# Patient Record
Sex: Female | Born: 1960 | Smoking: Current some day smoker
Health system: Southern US, Community
[De-identification: ages and names within clinical notes are randomized; demographics above are authoritative.]

## PROBLEM LIST (undated history)

## (undated) DIAGNOSIS — J45909 Unspecified asthma, uncomplicated: Secondary | ICD-10-CM

## (undated) DIAGNOSIS — E669 Obesity, unspecified: Secondary | ICD-10-CM

## (undated) DIAGNOSIS — J439 Emphysema, unspecified: Secondary | ICD-10-CM

## (undated) DIAGNOSIS — J449 Chronic obstructive pulmonary disease, unspecified: Secondary | ICD-10-CM

---

## 2016-11-25 DIAGNOSIS — J449 Chronic obstructive pulmonary disease, unspecified: Secondary | ICD-10-CM | POA: Diagnosis present

## 2018-05-23 ENCOUNTER — Other Ambulatory Visit: Payer: Self-pay | Admitting: Preventative Medicine

## 2018-05-23 DIAGNOSIS — J449 Chronic obstructive pulmonary disease, unspecified: Secondary | ICD-10-CM

## 2018-05-23 DIAGNOSIS — J45909 Unspecified asthma, uncomplicated: Secondary | ICD-10-CM

## 2018-05-23 DIAGNOSIS — J439 Emphysema, unspecified: Secondary | ICD-10-CM

## 2018-06-21 ENCOUNTER — Ambulatory Visit: Payer: Disability Insurance | Attending: Preventative Medicine

## 2018-06-21 DIAGNOSIS — J449 Chronic obstructive pulmonary disease, unspecified: Secondary | ICD-10-CM | POA: Diagnosis not present

## 2018-06-21 DIAGNOSIS — J439 Emphysema, unspecified: Secondary | ICD-10-CM

## 2018-06-21 DIAGNOSIS — J45909 Unspecified asthma, uncomplicated: Secondary | ICD-10-CM

## 2018-06-21 NOTE — Progress Notes (Unsigned)
Attempted to do Spirometry but patient stated she was not feeling well and felt that she was coming down with something. She stated she did feel as if she was able to give her best at testing. She also stated she had been having chest pains off and on all morning. She was asked if she would like to be taken to the ER but she stated No. [place] [employer] services was contacted and Arlan Organ was spoken to about patients ability to do testing. It was decided for patient to reschedule test.

## 2020-08-31 ENCOUNTER — Observation Stay
Admission: EM | Admit: 2020-08-31 | Discharge: 2020-09-01 | DRG: 177 | Discharge status: Against Medical Advice | Payer: Medicaid Other | Attending: Internal Medicine | Admitting: Internal Medicine

## 2020-08-31 DIAGNOSIS — U071 COVID-19: Secondary | ICD-10-CM | POA: Insufficient documentation

## 2020-08-31 DIAGNOSIS — J439 Emphysema, unspecified: Secondary | ICD-10-CM | POA: Diagnosis not present

## 2020-08-31 DIAGNOSIS — J9601 Acute respiratory failure with hypoxia: Secondary | ICD-10-CM | POA: Insufficient documentation

## 2020-08-31 DIAGNOSIS — E871 Hypo-osmolality and hyponatremia: Secondary | ICD-10-CM | POA: Diagnosis present

## 2020-08-31 DIAGNOSIS — R0789 Other chest pain: Secondary | ICD-10-CM | POA: Insufficient documentation

## 2020-08-31 DIAGNOSIS — F17210 Nicotine dependence, cigarettes, uncomplicated: Secondary | ICD-10-CM | POA: Insufficient documentation

## 2020-08-31 DIAGNOSIS — Z5329 Procedure and treatment not carried out because of patient's decision for other reasons: Secondary | ICD-10-CM | POA: Insufficient documentation

## 2020-08-31 DIAGNOSIS — J1282 Pneumonia due to coronavirus disease 2019: Secondary | ICD-10-CM

## 2020-08-31 DIAGNOSIS — J449 Chronic obstructive pulmonary disease, unspecified: Secondary | ICD-10-CM | POA: Diagnosis present

## 2020-08-31 HISTORY — DX: Obesity, unspecified: E66.9

## 2020-08-31 HISTORY — DX: Emphysema, unspecified: J43.9

## 2020-08-31 HISTORY — DX: Unspecified asthma, uncomplicated: J45.909

## 2020-08-31 HISTORY — DX: Chronic obstructive pulmonary disease, unspecified: J44.9

## 2020-08-31 NOTE — ED Triage Notes (Signed)
Pt called ems this evening for SHOB starting at 4pm. Pt was low 90s on RA. Pt placed onNC, 20g placed in RAC. Pt palced on stretcher on arrival. Sating 92 RA.

## 2020-09-01 ENCOUNTER — Other Ambulatory Visit: Payer: Self-pay

## 2020-09-01 ENCOUNTER — Emergency Department: Payer: Medicaid Other

## 2020-09-01 ENCOUNTER — Encounter: Payer: Self-pay | Admitting: Emergency Medicine

## 2020-09-01 DIAGNOSIS — J1282 Pneumonia due to coronavirus disease 2019: Secondary | ICD-10-CM

## 2020-09-01 DIAGNOSIS — E871 Hypo-osmolality and hyponatremia: Secondary | ICD-10-CM | POA: Diagnosis present

## 2020-09-01 DIAGNOSIS — J9601 Acute respiratory failure with hypoxia: Secondary | ICD-10-CM | POA: Diagnosis present

## 2020-09-01 DIAGNOSIS — J9600 Acute respiratory failure, unspecified whether with hypoxia or hypercapnia: Secondary | ICD-10-CM

## 2020-09-01 DIAGNOSIS — U071 COVID-19: Principal | ICD-10-CM

## 2020-09-01 DIAGNOSIS — F17210 Nicotine dependence, cigarettes, uncomplicated: Secondary | ICD-10-CM | POA: Diagnosis present

## 2020-09-01 DIAGNOSIS — J439 Emphysema, unspecified: Secondary | ICD-10-CM | POA: Diagnosis present

## 2020-09-01 LAB — CBC WITH DIFFERENTIAL/PLATELET
Abs Immature Granulocytes: 0.06 10*3/uL (ref 0.00–0.07)
Basophils Absolute: 0.1 10*3/uL (ref 0.0–0.1)
Basophils Relative: 1 %
Eosinophils Absolute: 0.3 10*3/uL (ref 0.0–0.5)
Eosinophils Relative: 3 %
HCT: 43 % (ref 36.0–46.0)
Hemoglobin: 13.1 g/dL (ref 12.0–15.0)
Immature Granulocytes: 1 %
Lymphocytes Relative: 13 %
Lymphs Abs: 1.5 10*3/uL (ref 0.7–4.0)
MCH: 21 pg — ABNORMAL LOW (ref 26.0–34.0)
MCHC: 30.5 g/dL (ref 30.0–36.0)
MCV: 68.9 fL — ABNORMAL LOW (ref 80.0–100.0)
Monocytes Absolute: 0.8 10*3/uL (ref 0.1–1.0)
Monocytes Relative: 8 %
Neutro Abs: 8.3 10*3/uL — ABNORMAL HIGH (ref 1.7–7.7)
Neutrophils Relative %: 74 %
Platelets: 283 10*3/uL (ref 150–400)
RBC: 6.24 MIL/uL — ABNORMAL HIGH (ref 3.87–5.11)
RDW: 16.7 % — ABNORMAL HIGH (ref 11.5–15.5)
WBC: 11 10*3/uL — ABNORMAL HIGH (ref 4.0–10.5)
nRBC: 0 % (ref 0.0–0.2)

## 2020-09-01 LAB — COMPREHENSIVE METABOLIC PANEL
ALT: 22 U/L (ref 0–44)
AST: 22 U/L (ref 15–41)
Albumin: 3.9 g/dL (ref 3.5–5.0)
Alkaline Phosphatase: 91 U/L (ref 38–126)
Anion gap: 7 (ref 5–15)
BUN: 8 mg/dL (ref 6–20)
CO2: 24 mmol/L (ref 22–32)
Calcium: 8.8 mg/dL — ABNORMAL LOW (ref 8.9–10.3)
Chloride: 103 mmol/L (ref 98–111)
Creatinine, Ser: 0.65 mg/dL (ref 0.44–1.00)
GFR, Estimated: >60 mL/min (ref >60)
Glucose, Bld: 101 mg/dL — ABNORMAL HIGH (ref 70–99)
Potassium: 3.9 mmol/L (ref 3.5–5.1)
Sodium: 134 mmol/L — ABNORMAL LOW (ref 135–145)
Total Bilirubin: 0.6 mg/dL (ref 0.3–1.2)
Total Protein: 7.3 g/dL (ref 6.5–8.1)

## 2020-09-01 LAB — CBC
HCT: 43.5 % (ref 36.0–46.0)
Hemoglobin: 13.6 g/dL (ref 12.0–15.0)
MCH: 21.4 pg — ABNORMAL LOW (ref 26.0–34.0)
MCHC: 31.3 g/dL (ref 30.0–36.0)
MCV: 68.4 fL — ABNORMAL LOW (ref 80.0–100.0)
Platelets: 269 10*3/uL (ref 150–400)
RBC: 6.36 MIL/uL — ABNORMAL HIGH (ref 3.87–5.11)
RDW: 17.3 % — ABNORMAL HIGH (ref 11.5–15.5)
WBC: 8.2 10*3/uL (ref 4.0–10.5)
nRBC: 0 % (ref 0.0–0.2)

## 2020-09-01 LAB — HIV ANTIBODY (ROUTINE TESTING W REFLEX): HIV Screen 4th Generation wRfx: NONREACTIVE

## 2020-09-01 LAB — RESP PANEL BY RT-PCR (FLU A&B, COVID) ARPGX2
Influenza A by PCR: NEGATIVE
Influenza B by PCR: NEGATIVE
SARS Coronavirus 2 by RT PCR: POSITIVE — AB

## 2020-09-01 LAB — CREATININE, SERUM
Creatinine, Ser: 0.56 mg/dL (ref 0.44–1.00)
GFR, Estimated: >60 mL/min (ref >60)

## 2020-09-01 LAB — BRAIN NATRIURETIC PEPTIDE: B Natriuretic Peptide: 25.8 pg/mL (ref 0.0–100.0)

## 2020-09-01 LAB — TROPONIN I (HIGH SENSITIVITY)
Troponin I (High Sensitivity): 3 ng/L (ref <18)
Troponin I (High Sensitivity): 3 ng/L (ref <18)

## 2020-09-01 MED ORDER — SODIUM CHLORIDE 0.9 % IV SOLN: 100.0000 mg | Freq: Every day | INTRAVENOUS | Status: DC

## 2020-09-01 MED ORDER — ENOXAPARIN SODIUM 40 MG/0.4ML ~~LOC~~ SOLN
40.0000 mg | SUBCUTANEOUS | Status: DC
  Administered 2020-09-01: 10:00:00 40 mg via SUBCUTANEOUS
  Filled 2020-09-01: qty 0.4

## 2020-09-01 MED ORDER — ASCORBIC ACID 500 MG PO TABS
500.0000 mg | ORAL_TABLET | Freq: Every day | ORAL | Status: DC
  Administered 2020-09-01: 10:00:00 500 mg via ORAL
  Filled 2020-09-01: qty 1

## 2020-09-01 MED ORDER — UMECLIDINIUM BROMIDE 62.5 MCG/INH IN AEPB
1.0000 | INHALATION_SPRAY | Freq: Every day | RESPIRATORY_TRACT | Status: DC
  Filled 2020-09-01: qty 7

## 2020-09-01 MED ORDER — DIPHENHYDRAMINE HCL 50 MG/ML IJ SOLN: 50.0000 mg | Freq: Once | INTRAMUSCULAR | Status: DC | PRN

## 2020-09-01 MED ORDER — ZINC SULFATE 220 (50 ZN) MG PO CAPS
220.0000 mg | ORAL_CAPSULE | Freq: Every day | ORAL | Status: DC
  Administered 2020-09-01: 10:00:00 220 mg via ORAL
  Filled 2020-09-01: qty 1

## 2020-09-01 MED ORDER — ONDANSETRON HCL 4 MG PO TABS: 4.0000 mg | ORAL_TABLET | Freq: Four times a day (QID) | ORAL | Status: DC | PRN

## 2020-09-01 MED ORDER — FLUTICASONE FUROATE-VILANTEROL 100-25 MCG/INH IN AEPB
1.0000 | INHALATION_SPRAY | Freq: Every day | RESPIRATORY_TRACT | Status: DC
  Filled 2020-09-01: qty 28

## 2020-09-01 MED ORDER — ALBUTEROL SULFATE HFA 108 (90 BASE) MCG/ACT IN AERS
2.0000 | INHALATION_SPRAY | Freq: Four times a day (QID) | RESPIRATORY_TRACT | Status: DC
  Administered 2020-09-01: 12:00:00 2 via RESPIRATORY_TRACT
  Filled 2020-09-01 (×2): qty 6.7

## 2020-09-01 MED ORDER — ACETAMINOPHEN 325 MG PO TABS: 650.0000 mg | ORAL_TABLET | Freq: Four times a day (QID) | ORAL | Status: DC | PRN

## 2020-09-01 MED ORDER — LORATADINE 10 MG PO TABS: 10.0000 mg | ORAL_TABLET | Freq: Every day | ORAL | Status: DC

## 2020-09-01 MED ORDER — ONDANSETRON HCL 4 MG/2ML IJ SOLN: 4.0000 mg | Freq: Four times a day (QID) | INTRAMUSCULAR | Status: DC | PRN

## 2020-09-01 MED ORDER — AZITHROMYCIN 500 MG PO TABS: 250.0000 mg | ORAL_TABLET | Freq: Every day | ORAL | Status: DC

## 2020-09-01 MED ORDER — SODIUM CHLORIDE 0.9 % IV SOLN: INTRAVENOUS | Status: DC | PRN

## 2020-09-01 MED ORDER — SODIUM CHLORIDE 0.9 % IV BOLUS
500.0000 mL | Freq: Once | INTRAVENOUS | Status: AC
  Administered 2020-09-01: 02:00:00 500 mL via INTRAVENOUS

## 2020-09-01 MED ORDER — DEXAMETHASONE SODIUM PHOSPHATE 10 MG/ML IJ SOLN: 6.0000 mg | INTRAMUSCULAR | Status: DC

## 2020-09-01 MED ORDER — ALBUTEROL SULFATE HFA 108 (90 BASE) MCG/ACT IN AERS
2.0000 | INHALATION_SPRAY | Freq: Once | RESPIRATORY_TRACT | Status: DC | PRN
  Filled 2020-09-01: qty 6.7

## 2020-09-01 MED ORDER — FAMOTIDINE IN NACL 20-0.9 MG/50ML-% IV SOLN
20.0000 mg | Freq: Once | INTRAVENOUS | Status: DC | PRN
  Filled 2020-09-01: qty 50

## 2020-09-01 MED ORDER — ETESEVIMAB 700MG/ 20ML INJECTION: Freq: Once | INTRAVENOUS | Status: DC

## 2020-09-01 MED ORDER — FLUTICASONE-UMECLIDIN-VILANT 100-62.5-25 MCG/INH IN AEPB: 1.0000 | INHALATION_SPRAY | Freq: Every day | RESPIRATORY_TRACT | Status: DC

## 2020-09-01 MED ORDER — ACETAMINOPHEN 500 MG PO TABS
1000.0000 mg | ORAL_TABLET | Freq: Once | ORAL | Status: AC
  Administered 2020-09-01: 02:00:00 1000 mg via ORAL
  Filled 2020-09-01: qty 2

## 2020-09-01 MED ORDER — IOHEXOL 350 MG/ML SOLN
100.0000 mL | Freq: Once | INTRAVENOUS | Status: AC | PRN
  Administered 2020-09-01: 100 mL via INTRAVENOUS

## 2020-09-01 MED ORDER — METHYLPREDNISOLONE SODIUM SUCC 125 MG IJ SOLR: 125.0000 mg | Freq: Once | INTRAMUSCULAR | Status: DC | PRN

## 2020-09-01 MED ORDER — GUAIFENESIN-DM 100-10 MG/5ML PO SYRP: 10.0000 mL | ORAL_SOLUTION | ORAL | Status: DC | PRN

## 2020-09-01 MED ORDER — HYDROCOD POLST-CPM POLST ER 10-8 MG/5ML PO SUER: 5.0000 mL | Freq: Two times a day (BID) | ORAL | Status: DC | PRN

## 2020-09-01 MED ORDER — EPINEPHRINE 0.3 MG/0.3ML IJ SOAJ
0.3000 mg | Freq: Once | INTRAMUSCULAR | Status: DC | PRN
  Filled 2020-09-01: qty 0.3

## 2020-09-01 MED ORDER — SODIUM CHLORIDE 0.9 % IV SOLN
200.0000 mg | Freq: Once | INTRAVENOUS | Status: AC
  Administered 2020-09-01: 04:00:00 200 mg via INTRAVENOUS
  Filled 2020-09-01: qty 200

## 2020-09-01 MED ORDER — AZITHROMYCIN 500 MG PO TABS: 500.0000 mg | ORAL_TABLET | Freq: Every day | ORAL | Status: DC

## 2020-09-01 MED ORDER — DEXAMETHASONE SODIUM PHOSPHATE 10 MG/ML IJ SOLN
10.0000 mg | Freq: Once | INTRAMUSCULAR | Status: AC
  Administered 2020-09-01: 02:00:00 10 mg via INTRAVENOUS
  Filled 2020-09-01: qty 1

## 2020-09-01 MED ORDER — SODIUM CHLORIDE 0.9 % IV SOLN: 200.0000 mg | Freq: Once | INTRAVENOUS | Status: DC

## 2020-09-01 MED ORDER — KETOROLAC TROMETHAMINE 30 MG/ML IJ SOLN
30.0000 mg | Freq: Once | INTRAMUSCULAR | Status: AC
  Administered 2020-09-01: 09:00:00 30 mg via INTRAVENOUS
  Filled 2020-09-01: qty 1

## 2020-09-01 NOTE — Progress Notes (Signed)
Pharmacy COVID-19 Monoclonal Antibody Screening  Aimee Hooper was identified as being not hospitalized with symptoms from Covid-19 on admission but an incidental positive PCR has been documented.  The patient may qualify for the use of monoclonal antibodies (mAB) for COVID-19 viral infection to prevent worsening symptoms stemming from Covid-19 infection.  The patient was identified based on a positive COVID-19 PCR and not requiring the use of supplemental oxygen at this time.  This patient meets the FDA criteria for Emergency Use Authorization of casirivimab/imdevimab or bamlanivimab/etesevimab.  Has a (+) direct SARS-CoV-2 viral test result  Is NOT hospitalized due to COVID-19  Is within 10 days of symptom onset  Has at least one of the high risk factor(s) for progression to severe COVID-19 and/or hospitalization as defined in EUA.  Specific high risk criteria : Chronic Lung Disease  Additionally: The patient  HAS NOT had a positive COVID-19 PCR in the last 90 days.  The patient is partially vaccinated against COVID-19.  The patient does not meet criteria for mAB administration due to being partially or fully vaccinated for COVID-19, asymptomatic, with a cycle time > 32. Patient is being hospitalized for respiratory failure due to COVID-19 (per notes). D/w Dr. Nelson Chimes that does not meet in-patient criteria (on oxygen, but near baseline) and primary reason for hospitalization is for COVID.   This eligibility and indication for treatment was discussed with the patient's physician: Dr. Nelson Chimes  Plan: Based on the above discussion, it was decided that the patient will NOT receive a dose of mAB combination.   Katha Cabal 09/01/2020  1:27 PM

## 2020-09-01 NOTE — Progress Notes (Signed)
PROGRESS NOTE    Aimee Hooper  GYB:[id number] DOB: 03-18-1961 DOA: 08/31/2020 PCP: Patient, No Pcp Per   Brief Narrative: Taken from H&P. Aimee Hooper is a 59 y.o. female with medical history significant for stage III COPD not on home oxygen who presents to the emergency room with sudden onset shortness of breath associated with retrosternal chest pain.  Pain described as sharp, nonradiating, of moderate intensity.  She was previously in her usual state of health and denies recent fever or chills.  Denies Covid exposure.  Had only one Covid vaccine a month prior.   She was febrile at 100.2, COVID-19 positive, mildly hypoxic requiring 2 L of oxygen.  She was started on remdesivir and steroid.  Subjective: Patient is feeling little better when seen this morning, continue to have some shortness of breath.  No nausea or vomiting.  No chest pain.  Assessment & Plan:   Principal Problem:   Pneumonia due to COVID-19 virus Active Problems:   Stage 3 severe COPD by GOLD classification (HCC)   Acute respiratory failure due to COVID-19 [facility])  Acute hypoxic respiratory failure due to COVID-19 infection/COPD exacerbation.  Patient did had some tight chest.  CTA negative for PE or any other acute pulmonary pathology.  Signs of chronic emphysema. Requiring 2 L of oxygen to maintain saturation above 90%, patient has advanced COPD at baseline and might be becoming oxygen dependent. Got 1 dose of remdesivir in ED. Good candidate for monoclonal antibody which was ordered. -Continue with steroid. -Continue with bronchodilators -Z-Pak for COPD exacerbation. -Continue with supportive care. -Supplemental oxygen to keep saturation above 90%-wean as tolerated. -Monitor inflammatory markers  Chest pain.  Patient has atypical chest pain which has been resolved now.  EKG without any acute changes and troponin remain negative.  CTA was negative for PE. -Monitor.  Objective: Vitals:   09/01/20  0927 09/01/20 0930 09/01/20 1100 09/01/20 1200  BP:  (!) 84/64 96/66 (!) 110/58  Pulse: 83 80 79 84  Resp: 20 20 20  (!) 24  Temp:    97.8 F (36.6 C)  TempSrc:    Oral  SpO2: 94% 94% 93% 92%  Weight:      Height:       No intake or output data in the 24 hours ending 09/01/20 1247 Filed Weights   08/31/20 2344  Weight: 77.1 kg    Examination:  General exam: Appears calm and comfortable  Respiratory system: Clear to auscultation, mildly decreased air entry, no wheezing, respiratory effort normal. Cardiovascular system: S1 & S2 heard, RRR.  Gastrointestinal system: Soft, nontender, nondistended, bowel sounds positive. Central nervous system: Alert and oriented. No focal neurological deficits. Extremities: No edema, no cyanosis, pulses intact and symmetrical. Psychiatry: Judgement and insight appear normal. Mood & affect appropriate.    DVT prophylaxis: Lovenox Code Status: Full Family Communication: Discussed with patient, no one listed on her chart. Disposition Plan:  Status is: Inpatient  Remains inpatient appropriate because:Inpatient level of care appropriate due to severity of illness   Dispo: The patient is from: Home              Anticipated d/c is to: Home              Anticipated d/c date is: 2 days              Patient currently is not medically stable to d/c.   Consultants:   None  Procedures:  Antimicrobials:   Data Reviewed: I have  personally reviewed following labs and imaging studies  CBC: Recent Labs  Lab 08/31/20 2354 09/01/20 0655  WBC 11.0* 8.2  NEUTROABS 8.3*  --   HGB 13.1 13.6  HCT 43.0 43.5  MCV 68.9* 68.4*  PLT 283 269   Basic Metabolic Panel: Recent Labs  Lab 08/31/20 2354 09/01/20 0655  NA 134*  --   K 3.9  --   CL 103  --   CO2 24  --   GLUCOSE 101*  --   BUN 8  --   CREATININE 0.65 0.56  CALCIUM 8.8*  --    GFR: Estimated Creatinine Clearance: 68.6 mL/min (by C-G formula based on SCr of 0.56 mg/dL). Liver Function  Tests: Recent Labs  Lab 08/31/20 2354  AST 22  ALT 22  ALKPHOS 91  BILITOT 0.6  PROT 7.3  ALBUMIN 3.9   No results for input(s): LIPASE, AMYLASE in the last 168 hours. No results for input(s): AMMONIA in the last 168 hours. Coagulation Profile: No results for input(s): INR, PROTIME in the last 168 hours. Cardiac Enzymes: No results for input(s): CKTOTAL, CKMB, CKMBINDEX, TROPONINI in the last 168 hours. BNP (last 3 results) No results for input(s): PROBNP in the last 8760 hours. HbA1C: No results for input(s): HGBA1C in the last 72 hours. CBG: No results for input(s): GLUCAP in the last 168 hours. Lipid Profile: No results for input(s): CHOL, HDL, LDLCALC, TRIG, CHOLHDL, LDLDIRECT in the last 72 hours. Thyroid Function Tests: No results for input(s): TSH, T4TOTAL, FREET4, T3FREE, THYROIDAB in the last 72 hours. Anemia Panel: No results for input(s): VITAMINB12, FOLATE, FERRITIN, TIBC, IRON, RETICCTPCT in the last 72 hours. Sepsis Labs: No results for input(s): PROCALCITON, LATICACIDVEN in the last 168 hours.  Recent Results (from the past 240 hour(s))  Resp Panel by RT-PCR (Flu A&B, Covid) Nasopharyngeal Swab     Status: Abnormal   Collection Time: 08/31/20 11:56 PM   Specimen: Nasopharyngeal Swab; Nasopharyngeal(NP) swabs in vial transport medium  Result Value Ref Range Status   SARS Coronavirus 2 by RT PCR POSITIVE (A) NEGATIVE Final    Comment: RESULT CALLED TO, READ BACK BY AND VERIFIED WITH: JULIA SHEEHAN 09/01/20 AT 0112 HS    Influenza A by PCR NEGATIVE NEGATIVE Final   Influenza B by PCR NEGATIVE NEGATIVE Final    Comment: (NOTE) The Xpert Xpress SARS-CoV-2/FLU/RSV plus assay is intended as an aid in the diagnosis of influenza from Nasopharyngeal swab specimens and should not be used as a sole basis for treatment. Nasal washings and aspirates are unacceptable for Xpert Xpress SARS-CoV-2/FLU/RSV testing.  Fact Sheet for  Patients: BloggerCourse.com  Fact Sheet for Healthcare Providers: SeriousBroker.it  This test is not yet approved or cleared by the [place] FDA and has been authorized for detection and/or diagnosis of SARS-CoV-2 by FDA under an Emergency Use Authorization (EUA). This EUA will remain in effect (meaning this test can be used) for the duration of the COVID-19 declaration under Section 564(b)(1) of the Act, 21 U.S.C. section 360bbb-3(b)(1), unless the authorization is terminated or revoked.  Performed at [facility], [address], [place], [place] [address]      Radiology Studies: CT Angio Chest PE W/Cm &/Or Wo Cm  Result Date: 09/01/2020 CLINICAL DATA:  COVID positive with shortness of breath. EXAM: CT ANGIOGRAPHY CHEST WITH CONTRAST TECHNIQUE: Multidetector CT imaging of the chest was performed using the standard protocol during bolus administration of intravenous contrast. Multiplanar CT image reconstructions and MIPs were obtained to  evaluate the vascular anatomy. CONTRAST:  OMNIPAQUE IOHEXOL 350 MG/ML SOLN COMPARISON:  None. FINDINGS: Cardiovascular: Satisfactory opacification of the pulmonary arteries to the segmental level. No evidence of pulmonary embolism. Normal heart size. No pericardial effusion. Mediastinum/Nodes: No enlarged mediastinal, hilar, or axillary lymph nodes. Thyroid gland, trachea, and esophagus demonstrate no significant findings. Lungs/Pleura: There is mild emphysematous lung disease. Very mild linear atelectasis is seen along the medial aspects of the right upper lobe and left upper lobe. There is no evidence of acute infiltrate, pleural effusion or pneumothorax. Upper Abdomen: No acute abnormality. Musculoskeletal: No chest wall abnormality. No acute or significant osseous findings. Review of the MIP images confirms the above findings. IMPRESSION: 1. No CT evidence of acute pulmonary embolism.  2. No acute infiltrate, pleural effusion or pneumothorax. 3. Emphysema. Emphysema (ICD10-J43.9). Electronically Signed   By: Aram Candela M.D.   On: 09/01/2020 03:28   DG Chest Port 1 View  Result Date: 09/01/2020 CLINICAL DATA:  Shortness of breath. EXAM: PORTABLE CHEST 1 VIEW COMPARISON:  01/23/2020 FINDINGS: The cardiomediastinal silhouette is unchanged with normal heart size. Bronchitic changes are mildly increased compared to the prior study. No confluent airspace opacity, sizeable pleural effusion, or pneumothorax is identified. No acute osseous abnormality is seen. IMPRESSION: Mildly increased bronchitic changes. Electronically Signed   By: Sebastian Ache M.D.   On: 09/01/2020 00:19    Scheduled Meds: . albuterol  2 puff Inhalation Q6H  . vitamin C  500 mg Oral Daily  . dexamethasone (DECADRON) injection  6 mg Intravenous Q24H  . enoxaparin (LOVENOX) injection  40 mg Subcutaneous Q24H  . fluticasone furoate-vilanterol  1 puff Inhalation Daily   And  . umeclidinium bromide  1 puff Inhalation Daily  . loratadine  10 mg Oral Daily  . zinc sulfate  220 mg Oral Daily   Continuous Infusions: . [START ON 09/02/2020] remdesivir 100 mg in NS 100 mL       LOS: 0 days   Time spent: 35 minutes.  Arnetha Courser, MD [facility]  If 7PM-7AM, please contact night-coverage [email or web address]  09/01/2020, 12:47 PM   This record has been created using Conservation officer, historic buildings. Errors have been sought and corrected,but may not always be located. Such creation errors do not reflect on the standard of care.

## 2020-09-01 NOTE — ED Notes (Signed)
Breakfast tray provided. Pt sitting up in bed.

## 2020-09-01 NOTE — ED Notes (Signed)
Attending provider, Dr. Nelson Chimes, at bedside. She decreased oxygen to 1L per [place].

## 2020-09-01 NOTE — ED Notes (Signed)
Pharmacy messaged re: missing inhaler.

## 2020-09-01 NOTE — Progress Notes (Signed)
Pt called me to room states that she wants to leave AMA because she doesn't like hearing the hepafilter in the room, explained reason why we have to use hepafilter, states that she really doesn't care, she still wants to leave because she didn't want to come to this hospital anyway, she told the EMS drivers to go to [facility] but they brought her here, states that her husband is taking her to [facility], Dr Nelson Chimes made aware that pt signed AMA papers, Dr Nelson Chimes acknowledged this

## 2020-09-01 NOTE — ED Notes (Signed)
Sent msg to receiving nurse on 1C.

## 2020-09-01 NOTE — ED Notes (Signed)
Upon answering call bell, pt had [place] off and spo2 was 88. Pt was tearful asking about Covid swab result. Pt was informed that the results can take a few hours. Pt asking "When can I go home." Pt was informed that with their hr and spo2, they should remain in the ER. Pt agreeable and resting in bed.

## 2020-09-01 NOTE — ED Notes (Signed)
Pt SPO2 was going down to 88-89% on 1L per [place]. Turned oxygen back to 2L. SPO2 now 90-93%.

## 2020-09-01 NOTE — ED Notes (Signed)
Sent msg to pharmacy for Trelegy inhaler to be sent. Pt given [employer] sandwich that was brought to front desk by family. Pt walked to room toilet and urinated. Steady gait. Back in bed.

## 2020-09-01 NOTE — ED Notes (Signed)
Pt requesting for PTA/home med 'Trelegy' to be ordered. Messaged provider about this.

## 2020-09-01 NOTE — ED Notes (Signed)
Albuterol inhaler not available yet. Will administer when available.

## 2020-09-01 NOTE — Progress Notes (Signed)
Pts husband here at this time to push pt out via wheelchair

## 2020-09-01 NOTE — ED Provider Notes (Signed)
[facility] Emergency Department Provider Note  ____________________________________________   Event Date/Time   First MD Initiated Contact with Patient 08/31/20 2359     (approximate)  I have reviewed the triage vital signs and the nursing notes.   HISTORY  Chief Complaint Shortness of Breath    HPI Aimee Hooper is a 59 y.o. female with medical history as listed below which includes stage III COPD but who does not use oxygen at baseline.  She has had 1 COVID-19 vaccination about a month ago.  She presents tonight by EMS for acute onset and severe shortness of breath associated with sharp chest pains in various places in her anterior chest.  She has not had a recent fever.  The symptoms started just a few hours prior to arrival.  Nothing particular makes them better or worse (except for exertion which makes his shortness of breath worse ) and they are severe.   She said she felt perfectly fine yesterday.  She got together with her family for the holidays.  She has not been around anyone known to have a COVID-19.  She denies nasal congestion, sore throat, loss of smell and taste, abdominal pain, nausea, and vomiting.  She has no history of blood clots in the legs of the lungs and she does not take anticoagulation.        Past Medical History:  Diagnosis Date  . Asthma   . Emphysema lung (HCC)   . Obesity   . Stage 3 severe COPD by GOLD classification [facility](HCC)     Patient Active Problem List   Diagnosis Date Noted  . Pneumonia due to COVID-19 virus 09/01/2020  . Acute respiratory failure due to COVID-19 (HCC) 09/01/2020  . Stage 3 severe COPD by GOLD classification (HCC) 11/25/2016    History reviewed. No pertinent surgical history.  Prior to Admission medications   Not on File    Allergies Patient has no allergy information on record.  History reviewed. No pertinent family history.  Social History Social History   Tobacco Use  .  Smoking status: Current Some Day Smoker    Types: Cigarettes  . Smokeless tobacco: Never Used  Substance Use Topics  . Alcohol use: Never  . Drug use: Never    Review of Systems Constitutional: No fever/chills Eyes: No visual changes. ENT: No sore throat. Cardiovascular: +chest pain. Respiratory: +shortness of breath. Gastrointestinal: No abdominal pain.  No nausea, no vomiting.  No diarrhea.  No constipation. Genitourinary: Negative for dysuria. Musculoskeletal: Negative for neck pain.  Negative for back pain. Integumentary: Negative for rash. Neurological: Negative for headaches, focal weakness or numbness.   ____________________________________________   PHYSICAL EXAM:  VITAL SIGNS: ED Triage Vitals  Enc Vitals Group     BP 08/31/20 2343 (!) 117/96     Pulse Rate 08/31/20 2343 (!) 134     Resp 09/01/20 0000 (!) 22     Temp 08/31/20 2343 100.2 F (37.9 C)     Temp Source 08/31/20 2343 Oral     SpO2 08/31/20 2343 92 %     Weight 08/31/20 2344 77.1 kg (170 lb)     Height 08/31/20 2344 1.524 m (5')     Head Circumference --      Peak Flow --      Pain Score 08/31/20 2344 0     Pain Loc --      Pain Edu? --      Excl. in GC? --  Constitutional: Alert and oriented.  Eyes: Conjunctivae are normal.  Head: Atraumatic. Nose: No congestion/rhinnorhea. Mouth/Throat: Patient is wearing a mask. Neck: No stridor.  No meningeal signs.   Cardiovascular: Tachycardia, regular rhythm. Good peripheral circulation. Respiratory: Moderate respiratory distress, now on 2 L of oxygen.  Increased respiratory rate.  Some wheezing with coarse breath sounds. Gastrointestinal: Obese.  Soft and nontender. No distention.  Musculoskeletal: No lower extremity tenderness nor edema. No gross deformities of extremities. Neurologic:  Normal speech and language. No gross focal neurologic deficits are appreciated.  Skin:  Skin is warm, dry and intact. Psychiatric: Mood and affect are normal.  Speech and behavior are normal.  ____________________________________________   LABS (all labs ordered are listed, but only abnormal results are displayed)  Labs Reviewed  RESP PANEL BY RT-PCR (FLU A&B, COVID) ARPGX2 - Abnormal; Notable for the following components:      Result Value   SARS Coronavirus 2 by RT PCR POSITIVE (*)    All other components within normal limits  COMPREHENSIVE METABOLIC PANEL - Abnormal; Notable for the following components:   Sodium 134 (*)    Glucose, Bld 101 (*)    Calcium 8.8 (*)    All other components within normal limits  CBC WITH DIFFERENTIAL/PLATELET - Abnormal; Notable for the following components:   WBC 11.0 (*)    RBC 6.24 (*)    MCV 68.9 (*)    MCH 21.0 (*)    RDW 16.7 (*)    Neutro Abs 8.3 (*)    All other components within normal limits  BRAIN NATRIURETIC PEPTIDE  HIV ANTIBODY (ROUTINE TESTING W REFLEX)  CBC  CREATININE, SERUM  TROPONIN I (HIGH SENSITIVITY)  TROPONIN I (HIGH SENSITIVITY)   ____________________________________________  EKG  ED ECG REPORT I, Loleta Rose, the attending physician, personally viewed and interpreted this ECG.  Date: 08/31/2020 EKG Time: 23: 50 Rate: 122 Rhythm: Sinus tachycardia QRS Axis: normal Intervals: normal ST/T Wave abnormalities: Non-specific ST segment / T-wave changes, but no clear evidence of acute ischemia. Narrative Interpretation: no definitive evidence of acute ischemia; does not meet STEMI criteria.   ____________________________________________  RADIOLOGY I, Loleta Rose, personally viewed and evaluated these images (plain radiographs) as part of my medical decision making, as well as reviewing the written report by the radiologist.  ED MD interpretation: Some bronchitic changes, no obvious pneumonia.  CTA chest demonstrates no pulmonary emboli and no obvious infection.  Official radiology report(s): CT Angio Chest PE W/Cm &/Or Wo Cm  Result Date: 09/01/2020 CLINICAL  DATA:  COVID positive with shortness of breath. EXAM: CT ANGIOGRAPHY CHEST WITH CONTRAST TECHNIQUE: Multidetector CT imaging of the chest was performed using the standard protocol during bolus administration of intravenous contrast. Multiplanar CT image reconstructions and MIPs were obtained to evaluate the vascular anatomy. CONTRAST:  OMNIPAQUE IOHEXOL 350 MG/ML SOLN COMPARISON:  None. FINDINGS: Cardiovascular: Satisfactory opacification of the pulmonary arteries to the segmental level. No evidence of pulmonary embolism. Normal heart size. No pericardial effusion. Mediastinum/Nodes: No enlarged mediastinal, hilar, or axillary lymph nodes. Thyroid gland, trachea, and esophagus demonstrate no significant findings. Lungs/Pleura: There is mild emphysematous lung disease. Very mild linear atelectasis is seen along the medial aspects of the right upper lobe and left upper lobe. There is no evidence of acute infiltrate, pleural effusion or pneumothorax. Upper Abdomen: No acute abnormality. Musculoskeletal: No chest wall abnormality. No acute or significant osseous findings. Review of the MIP images confirms the above findings. IMPRESSION: 1. No CT evidence of acute  pulmonary embolism. 2. No acute infiltrate, pleural effusion or pneumothorax. 3. Emphysema. Emphysema (ICD10-J43.9). Electronically Signed   By: Aram Candela M.D.   On: 09/01/2020 03:28   DG Chest Port 1 View  Result Date: 09/01/2020 CLINICAL DATA:  Shortness of breath. EXAM: PORTABLE CHEST 1 VIEW COMPARISON:  01/23/2020 FINDINGS: The cardiomediastinal silhouette is unchanged with normal heart size. Bronchitic changes are mildly increased compared to the prior study. No confluent airspace opacity, sizeable pleural effusion, or pneumothorax is identified. No acute osseous abnormality is seen. IMPRESSION: Mildly increased bronchitic changes. Electronically Signed   By: Sebastian Ache M.D.   On: 09/01/2020 00:19     ____________________________________________   PROCEDURES   Procedure(s) performed (including Critical Care):  .Critical Care Performed by: Loleta Rose, MD Authorized by: Loleta Rose, MD   Critical care provider statement:    Critical care time (minutes):  45   Critical care time was exclusive of:  Separately billable procedures and treating other patients   Critical care was necessary to treat or prevent imminent or life-threatening deterioration of the following conditions:  Respiratory failure   Critical care was time spent personally by me on the following activities:  Development of treatment plan with patient or surrogate, discussions with consultants, evaluation of patient's response to treatment, examination of patient, obtaining history from patient or surrogate, ordering and performing treatments and interventions, ordering and review of laboratory studies, ordering and review of radiographic studies, pulse oximetry, re-evaluation of patient's condition and review of old charts .1-3 Lead EKG Interpretation Performed by: Loleta Rose, MD Authorized by: Loleta Rose, MD     Interpretation: abnormal     ECG rate:  130   ECG rate assessment: tachycardic     Rhythm: sinus tachycardia     Ectopy: none     Conduction: normal       ____________________________________________   INITIAL IMPRESSION / MDM / ASSESSMENT AND PLAN / ED COURSE  As part of my medical decision making, I reviewed the following data within the electronic MEDICAL RECORD NUMBER Nursing notes reviewed and incorporated, Labs reviewed , EKG interpreted , Old chart reviewed, Radiograph reviewed , Discussed with admitting physician (Dr. Para March) and Notes from prior ED visits   Differential diagnosis includes, but is not limited to, COVID-19, pneumonia, PE, ACS.  EKG shows tachycardia but no obvious ischemia.  Vital signs are stable other than moderate tach cardia and low-grade fever.  Strongly suspect  COVID-19.  Patient was satting 88 to 89% on room air is now on 2 L of oxygen by nasal cannula.  Labs pending.  Anticipate admission.  The patient is on the cardiac monitor to evaluate for evidence of arrhythmia and/or significant heart rate changes.     Clinical Course as of 09/01/20 0355  Wynelle Link Sep 01, 2020  0026 I personally reviewed the patient's imaging and agree with the radiologist's interpretation that there is no evidence of any acute abnormality, just some bronchitic changes. [CF]  0049 Generally reassuring comprehensive metabolic panel and CBC, with no significant white blood cell count (11), normal hemoglobin, and essentially normal electrolytes other than a mild hyponatremia 134.  Kidney function is normal. [CF]  0241 SARS Coronavirus 2 by RT PCR(!): POSITIVE Patient is Covid positive.  She remains tachycardic in spite of a 500 mL normal saline fluid bolus.  Given her increased respiratory effort, the acute onset of her symptoms with no prodrome, and her hypoxemia, tachycardia, and tachypnea, I am going to evaluate with a CTA to  rule out PE.  I explained the results of the patient's labs including the COVID-19 result with the patient.  I also explained that she will need to stay in the hospital.  I ordered Decadron 10 mg IV which should help from a COPD perspective as well as potentially for the COVID-19 even though her chest x-ray is currently clear.  I also ordered remdesivir per pharmacy consult as per protocol.  Patient understands and agrees with the plan.  On 2 L of oxygen she is satting in the mid 90s. [CF]  0331 CT Angio Chest PE W/Cm &/Or Wo Cm No evidence of pneumonia nor PE on CTA chest.  Consulting the hospitalist for admission. [CF]  3005 Discussed case by phone with Dr. Para March with the hospitalist service.  She will admit. [CF]    Clinical Course User Index [CF] Loleta Rose, MD     ____________________________________________  FINAL CLINICAL IMPRESSION(S) / ED  DIAGNOSES  Final diagnoses:  Acute hypoxemic respiratory failure due to COVID-19 [facility])     MEDICATIONS GIVEN DURING THIS VISIT:  Medications  remdesivir 200 mg in sodium chloride 0.9% 250 mL IVPB (200 mg Intravenous New Bag/Given 09/01/20 0332)    Followed by  remdesivir 100 mg in sodium chloride 0.9 % 100 mL IVPB (has no administration in time range)  enoxaparin (LOVENOX) injection 40 mg (has no administration in time range)  albuterol (VENTOLIN HFA) 108 (90 Base) MCG/ACT inhaler 2 puff (has no administration in time range)  dexamethasone (DECADRON) injection 6 mg (has no administration in time range)  guaiFENesin-dextromethorphan (ROBITUSSIN DM) 100-10 MG/5ML syrup 10 mL (has no administration in time range)  chlorpheniramine-HYDROcodone (TUSSIONEX) 10-8 MG/5ML suspension 5 mL (has no administration in time range)  ascorbic acid (VITAMIN C) tablet 500 mg (has no administration in time range)  zinc sulfate capsule 220 mg (has no administration in time range)  acetaminophen (TYLENOL) tablet 650 mg (has no administration in time range)  ondansetron (ZOFRAN) tablet 4 mg (has no administration in time range)    Or  ondansetron (ZOFRAN) injection 4 mg (has no administration in time range)  sodium chloride 0.9 % bolus 500 mL (500 mLs Intravenous New Bag/Given 09/01/20 0215)  acetaminophen (TYLENOL) tablet 1,000 mg (1,000 mg Oral Given 09/01/20 0216)  dexamethasone (DECADRON) injection 10 mg (10 mg Intravenous Given 09/01/20 0218)  iohexol (OMNIPAQUE) 350 MG/ML injection 100 mL (100 mLs Intravenous Contrast Given 09/01/20 0301)     ED Discharge Orders    None      *Please note:  Aimee Hooper was evaluated in Emergency Department on 09/01/2020 for the symptoms described in the history of present illness. She was evaluated in the context of the global COVID-19 pandemic, which necessitated consideration that the patient might be at risk for infection with the SARS-CoV-2 virus that  causes COVID-19. Institutional protocols and algorithms that pertain to the evaluation of patients at risk for COVID-19 are in a state of rapid change based on information released by regulatory bodies including the CDC and federal and state organizations. These policies and algorithms were followed during the patient's care in the ED.  Some ED evaluations and interventions may be delayed as a result of limited staffing during and after the pandemic.*  Note:  This document was prepared using Dragon voice recognition software and may include unintentional dictation errors.   Loleta Rose, MD 09/01/20 [id number]

## 2020-09-01 NOTE — Progress Notes (Signed)
Remdesivir - Pharmacy Brief Note   A/P:  Remdesivir 200 mg IVPB once followed by 100 mg IVPB daily x 4 days.   Valrie Hart, PharmD Clinical Pharmacist  09/01/2020 2:18 AM

## 2020-09-01 NOTE — ED Notes (Signed)
Sent msg to pharmacy re: missing dose: albuterol inhaler.

## 2020-09-01 NOTE — ED Notes (Signed)
Pt vomited tylenol

## 2020-09-01 NOTE — ED Notes (Signed)
Lab called Covid +, Md aware.

## 2020-09-01 NOTE — H&P (Signed)
History and Physical    Bev Drennen JKD:[id number] DOB: July 06, 1961 DOA: 08/31/2020  PCP: Patient, No Pcp Per   Patient coming from: Home  I have personally briefly reviewed patient's old medical records in [facility]  Chief Complaint: Shortness of breath  HPI: Aimee Hooper is a 59 y.o. female with medical history significant for stage III COPD not on home oxygen who presents to the emergency room with sudden onset shortness of breath associated with retrosternal chest pain.  Pain described as sharp, nonradiating, of moderate intensity.  She was previously in her usual state of health and denies recent fever or chills.  Denies Covid exposure.  Had only one Covid vaccine a month prior.  She denies lower extremity pain or swelling and denies orthopnea.  Denies upper respiratory symptoms. ED Course: On arrival she had a temperature of 100.2, BP 117/96 with pulse 134, respirations 22 with O2 sat 92% on room air going as low as 88% with exertion.  Blood work significant for mild leukocytosis of 11,000 but otherwise unremarkable.  Troponin negative x2 and BNP normal at 25 Covid positive, flu negative. EKG as reviewed by me : Sinus tachycardia with nonspecific ST-T wave changes Imaging: Chest x-ray showed mild bronchitic changes  Hospitalist consulted for admission.  Review of Systems: As per HPI otherwise all other systems on review of systems negative.    Past Medical History:  Diagnosis Date  . Asthma   . Emphysema lung (HCC)   . Obesity   . Stage 3 severe COPD by GOLD classification (HCC)     History reviewed. No pertinent surgical history.   reports that she has been smoking cigarettes. She has never used smokeless tobacco. She reports that she does not drink alcohol and does not use drugs.  Not on File  History reviewed. No pertinent family history.    Prior to Admission medications   Not on File    Physical Exam: Vitals:   09/01/20 0145 09/01/20 0200  09/01/20 0334 09/01/20 0345  BP:  (!) 139/107 103/83   Pulse:  (!) 130  (!) 116  Resp:   (!) 29 (!) 22  Temp:      TempSrc:      SpO2: (!) 89% 90%  90%  Weight:      Height:         Vitals:   09/01/20 0145 09/01/20 0200 09/01/20 0334 09/01/20 0345  BP:  (!) 139/107 103/83   Pulse:  (!) 130  (!) 116  Resp:   (!) 29 (!) 22  Temp:      TempSrc:      SpO2: (!) 89% 90%  90%  Weight:      Height:          Constitutional: Alert and oriented x 3 .  Conversational dyspnea HEENT:      Head: Normocephalic and atraumatic.         Eyes: PERLA, EOMI, Conjunctivae are normal. Sclera is non-icteric.       Mouth/Throat: Mucous membranes are moist.       Neck: Supple with no signs of meningismus. Cardiovascular: Regular rate and rhythm. No murmurs, gallops, or rubs. 2+ symmetrical distal pulses are present . No JVD. No LE edema Respiratory: Moderate respiratory distress,  increased work of breathing .  Increased respiratory rate.    Wheezing throughout with few coarse breath sounds Gastrointestinal: Soft, non tender, and non distended with positive bowel sounds.  Genitourinary: No CVA tenderness. Musculoskeletal: Nontender  with normal range of motion in all extremities. No cyanosis, or erythema of extremities. Neurologic:  Face is symmetric. Moving all extremities. No gross focal neurologic deficits . Skin: Skin is warm, dry.  No rash or ulcers Psychiatric: Mood and affect are normal    Labs on Admission: I have personally reviewed following labs and imaging studies  CBC: Recent Labs  Lab 08/31/20 2354  WBC 11.0*  NEUTROABS 8.3*  HGB 13.1  HCT 43.0  MCV 68.9*  PLT 283   Basic Metabolic Panel: Recent Labs  Lab 08/31/20 2354  NA 134*  K 3.9  CL 103  CO2 24  GLUCOSE 101*  BUN 8  CREATININE 0.65  CALCIUM 8.8*   GFR: Estimated Creatinine Clearance: 68.6 mL/min (by C-G formula based on SCr of 0.65 mg/dL). Liver Function Tests: Recent Labs  Lab 08/31/20 2354  AST 22   ALT 22  ALKPHOS 91  BILITOT 0.6  PROT 7.3  ALBUMIN 3.9   No results for input(s): LIPASE, AMYLASE in the last 168 hours. No results for input(s): AMMONIA in the last 168 hours. Coagulation Profile: No results for input(s): INR, PROTIME in the last 168 hours. Cardiac Enzymes: No results for input(s): CKTOTAL, CKMB, CKMBINDEX, TROPONINI in the last 168 hours. BNP (last 3 results) No results for input(s): PROBNP in the last 8760 hours. HbA1C: No results for input(s): HGBA1C in the last 72 hours. CBG: No results for input(s): GLUCAP in the last 168 hours. Lipid Profile: No results for input(s): CHOL, HDL, LDLCALC, TRIG, CHOLHDL, LDLDIRECT in the last 72 hours. Thyroid Function Tests: No results for input(s): TSH, T4TOTAL, FREET4, T3FREE, THYROIDAB in the last 72 hours. Anemia Panel: No results for input(s): VITAMINB12, FOLATE, FERRITIN, TIBC, IRON, RETICCTPCT in the last 72 hours. Urine analysis: No results found for: COLORURINE, APPEARANCEUR, LABSPEC, PHURINE, GLUCOSEU, HGBUR, BILIRUBINUR, KETONESUR, PROTEINUR, UROBILINOGEN, NITRITE, LEUKOCYTESUR  Radiological Exams on Admission: CT Angio Chest PE W/Cm &/Or Wo Cm  Result Date: 09/01/2020 CLINICAL DATA:  COVID positive with shortness of breath. EXAM: CT ANGIOGRAPHY CHEST WITH CONTRAST TECHNIQUE: Multidetector CT imaging of the chest was performed using the standard protocol during bolus administration of intravenous contrast. Multiplanar CT image reconstructions and MIPs were obtained to evaluate the vascular anatomy. CONTRAST:  OMNIPAQUE IOHEXOL 350 MG/ML SOLN COMPARISON:  None. FINDINGS: Cardiovascular: Satisfactory opacification of the pulmonary arteries to the segmental level. No evidence of pulmonary embolism. Normal heart size. No pericardial effusion. Mediastinum/Nodes: No enlarged mediastinal, hilar, or axillary lymph nodes. Thyroid gland, trachea, and esophagus demonstrate no significant findings. Lungs/Pleura: There is  mild emphysematous lung disease. Very mild linear atelectasis is seen along the medial aspects of the right upper lobe and left upper lobe. There is no evidence of acute infiltrate, pleural effusion or pneumothorax. Upper Abdomen: No acute abnormality. Musculoskeletal: No chest wall abnormality. No acute or significant osseous findings. Review of the MIP images confirms the above findings. IMPRESSION: 1. No CT evidence of acute pulmonary embolism. 2. No acute infiltrate, pleural effusion or pneumothorax. 3. Emphysema. Emphysema (ICD10-J43.9). Electronically Signed   By: Aram Candela M.D.   On: 09/01/2020 03:28   DG Chest Port 1 View  Result Date: 09/01/2020 CLINICAL DATA:  Shortness of breath. EXAM: PORTABLE CHEST 1 VIEW COMPARISON:  01/23/2020 FINDINGS: The cardiomediastinal silhouette is unchanged with normal heart size. Bronchitic changes are mildly increased compared to the prior study. No confluent airspace opacity, sizeable pleural effusion, or pneumothorax is identified. No acute osseous abnormality is seen. IMPRESSION: Mildly increased  bronchitic changes. Electronically Signed   By: Sebastian Ache M.D.   On: 09/01/2020 00:19     Assessment/Plan 59 year old female with history of stage III COPD not on home oxygen who presenting with sudden onset shortness of breath associated with retrosternal chest pain.  She was previously in her usual state of health and denies recent fever or chills.  Denies Covid exposure.  Had only one Covid vaccine a month prior    Acute respiratory failure due to COVID-19 [facility]) -Etiology either related to Covid versus COPD exacerbation or both.  CTA chest negative for PE.  Showing bronchitic changes but no definite pneumonia -Patient presents with shortness of breath and chest pain testing positive for Covid with low-grade temperature of 100.2 -Patient was febrile and tachycardic with slightly elevated WBC of 11,000 -O2 sat as low as 88% on room air now on O2 at 2  L -Does not wear oxygen at baseline for his stage III COPD -Suspecting Covid pneumonia as etiology to respiratory failure during part may be related to COPD -Follow inflammatory biomarkers and procalcitonin -Remdesivir, albuterol, dexamethasone, antitussives and vitamins  Chest pain -Patient had retrosternal chest pains felt throughout her anterior chest -Troponin negative x2 and EKG nonacute -CTA ruled out PE -Continue to monitor  Exacerbation of stage 3 severe COPD by GOLD classification (HCC) -Albuterol as above    DVT prophylaxis: Lovenox  Code Status: full code  Family Communication:  none  Disposition Plan: Back to previous home environment Consults called: none  Status:At the time of admission, it appears that the appropriate admission status for this patient is INPATIENT. This is judged to be reasonable and necessary in order to provide the required intensity of service to ensure the patient's safety given the presenting symptoms, physical exam findings, and initial radiographic and laboratory data in the context of their  Comorbid conditions.   Patient requires inpatient status due to high intensity of service, high risk for further deterioration and high frequency of surveillance required.   I certify that at the point of admission it is my clinical judgment that the patient will require inpatient hospital care spanning beyond 2 midnights     Andris Baumann MD [facility]     09/01/2020, 4:26 AM

## 2020-09-03 NOTE — Discharge Summary (Signed)
                         Patient left AMA.  Aimee Cherney Brinkleyis a 59 y.o.femalewith medical history significant forstage III COPD not on home oxygen who presents to the emergency room with sudden onset shortness of breath associated withretrosternal chest pain.Pain described as sharp, nonradiating, of moderate intensity. She was previously in her usual state of health and denies recent fever or chills. Denies Covid exposure. Had only one Covid vaccine a month prior.  She was febrile at 100.2, COVID-19 positive, mildly hypoxic requiring 2 L of oxygen.  She was started on remdesivir and steroid.  She received 1 dose of remdesivir.  She was a good candidate for monoclonal antibody and received it next day.  Apparently patient left AMA as she wants to go to [facility].

## 2022-01-02 IMAGING — DX DG CHEST 1V PORT
1 series · 1 of 1 positions shown · non-contrast
Comparison: 01/23/2020

CLINICAL DATA: Shortness of breath.

EXAM:
PORTABLE CHEST 1 VIEW

[chest ap]
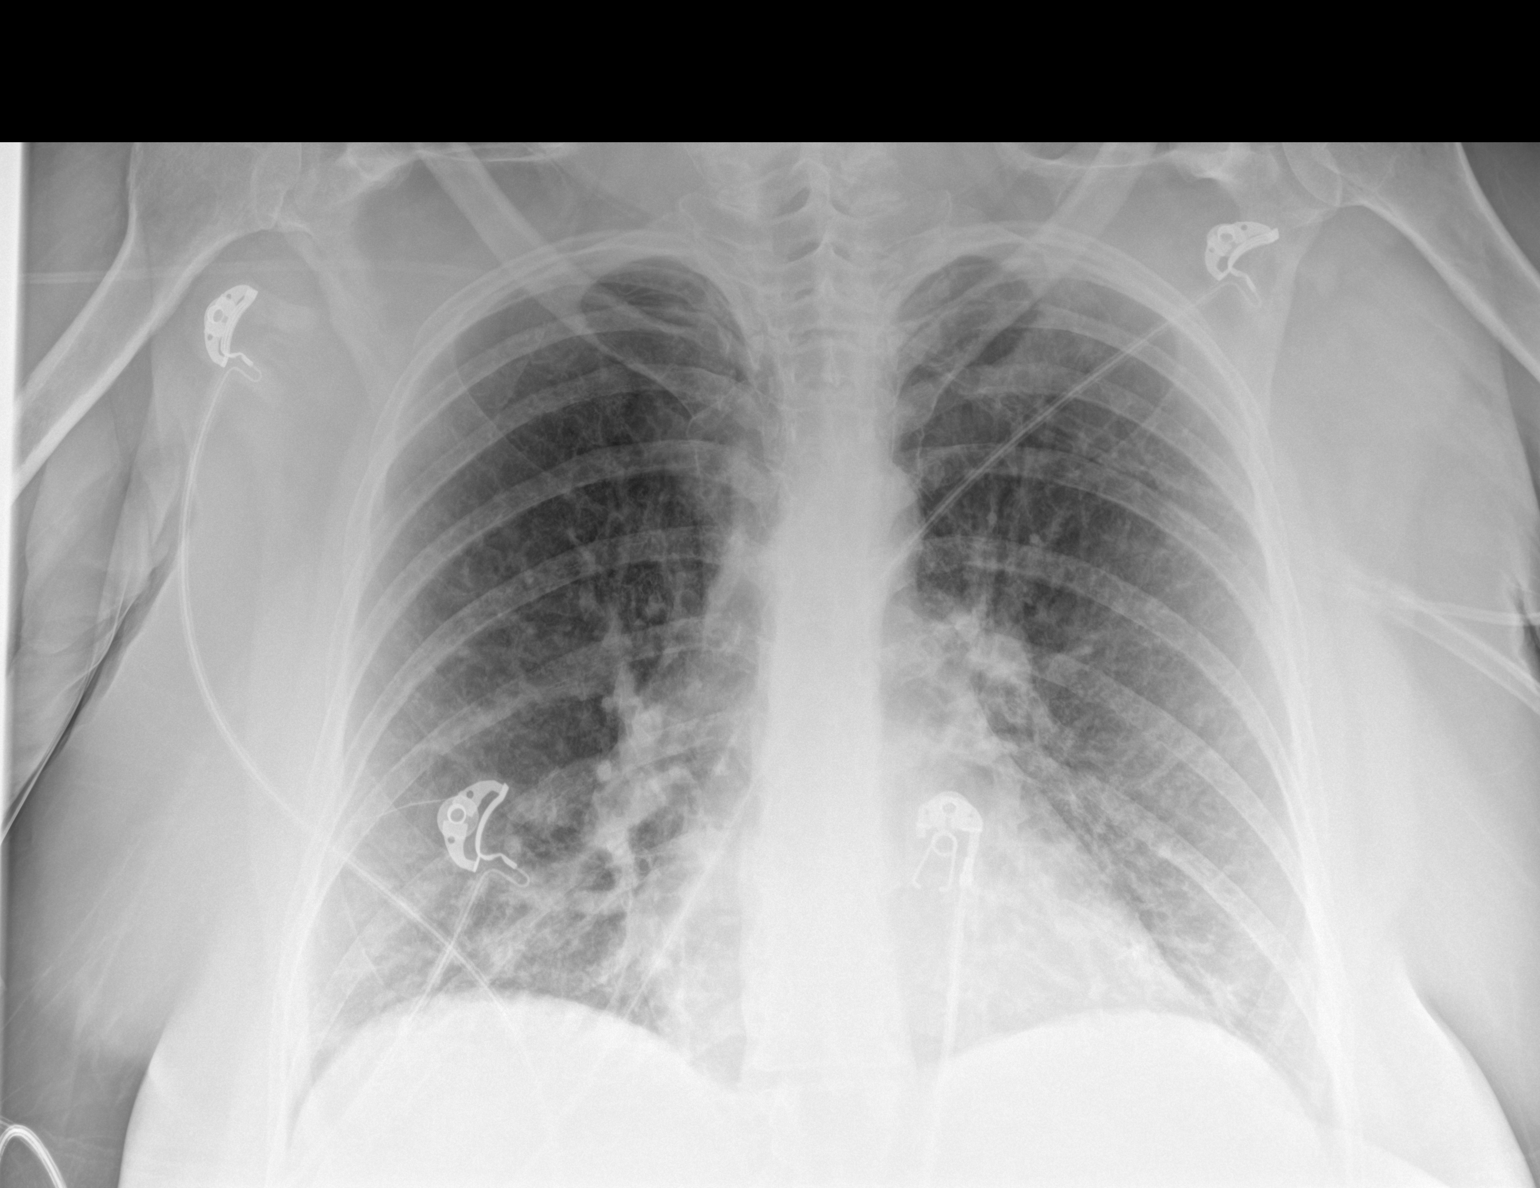

[1 of 1 positions shown; findings below may reference images not displayed]

FINDINGS: The cardiomediastinal silhouette is unchanged with normal heart
size. Bronchitic changes are mildly increased compared to the prior
study. No confluent airspace opacity, sizeable pleural effusion, or
pneumothorax is identified. No acute osseous abnormality is seen.
IMPRESSION: Mildly increased bronchitic changes.

## 2022-07-22 ENCOUNTER — Emergency Department: Payer: Medicaid Other

## 2022-07-22 ENCOUNTER — Inpatient Hospital Stay
Admission: EM | Admit: 2022-07-22 | Discharge: 2022-08-04 | DRG: 870 | Disposition: A | Discharge status: Home / Self Care | Payer: Medicaid Other | Attending: Internal Medicine | Admitting: Internal Medicine

## 2022-07-22 ENCOUNTER — Other Ambulatory Visit: Payer: Self-pay

## 2022-07-22 DIAGNOSIS — Z6841 Body Mass Index (BMI) 40.0 and over, adult: Secondary | ICD-10-CM | POA: Diagnosis not present

## 2022-07-22 DIAGNOSIS — G9341 Metabolic encephalopathy: Secondary | ICD-10-CM | POA: Diagnosis present

## 2022-07-22 DIAGNOSIS — F419 Anxiety disorder, unspecified: Secondary | ICD-10-CM | POA: Diagnosis present

## 2022-07-22 DIAGNOSIS — E874 Mixed disorder of acid-base balance: Secondary | ICD-10-CM | POA: Diagnosis present

## 2022-07-22 DIAGNOSIS — I5031 Acute diastolic (congestive) heart failure: Secondary | ICD-10-CM | POA: Diagnosis not present

## 2022-07-22 DIAGNOSIS — I959 Hypotension, unspecified: Secondary | ICD-10-CM | POA: Diagnosis not present

## 2022-07-22 DIAGNOSIS — H4921 Sixth [abducent] nerve palsy, right eye: Secondary | ICD-10-CM | POA: Diagnosis present

## 2022-07-22 DIAGNOSIS — A419 Sepsis, unspecified organism: Secondary | ICD-10-CM | POA: Diagnosis present

## 2022-07-22 DIAGNOSIS — J45901 Unspecified asthma with (acute) exacerbation: Secondary | ICD-10-CM | POA: Diagnosis present

## 2022-07-22 DIAGNOSIS — J9601 Acute respiratory failure with hypoxia: Secondary | ICD-10-CM | POA: Diagnosis not present

## 2022-07-22 DIAGNOSIS — E876 Hypokalemia: Secondary | ICD-10-CM | POA: Diagnosis not present

## 2022-07-22 DIAGNOSIS — F17210 Nicotine dependence, cigarettes, uncomplicated: Secondary | ICD-10-CM | POA: Diagnosis present

## 2022-07-22 DIAGNOSIS — B338 Other specified viral diseases: Secondary | ICD-10-CM

## 2022-07-22 DIAGNOSIS — J121 Respiratory syncytial virus pneumonia: Secondary | ICD-10-CM | POA: Diagnosis present

## 2022-07-22 DIAGNOSIS — Z720 Tobacco use: Secondary | ICD-10-CM | POA: Diagnosis not present

## 2022-07-22 DIAGNOSIS — Z1611 Resistance to penicillins: Secondary | ICD-10-CM | POA: Diagnosis present

## 2022-07-22 DIAGNOSIS — J9690 Respiratory failure, unspecified, unspecified whether with hypoxia or hypercapnia: Secondary | ICD-10-CM | POA: Diagnosis present

## 2022-07-22 DIAGNOSIS — D649 Anemia, unspecified: Secondary | ICD-10-CM | POA: Diagnosis present

## 2022-07-22 DIAGNOSIS — J189 Pneumonia, unspecified organism: Secondary | ICD-10-CM | POA: Diagnosis not present

## 2022-07-22 DIAGNOSIS — H40059 Ocular hypertension, unspecified eye: Secondary | ICD-10-CM | POA: Diagnosis present

## 2022-07-22 DIAGNOSIS — F1290 Cannabis use, unspecified, uncomplicated: Secondary | ICD-10-CM | POA: Diagnosis present

## 2022-07-22 DIAGNOSIS — R6520 Severe sepsis without septic shock: Secondary | ICD-10-CM | POA: Diagnosis present

## 2022-07-22 DIAGNOSIS — I119 Hypertensive heart disease without heart failure: Secondary | ICD-10-CM | POA: Diagnosis present

## 2022-07-22 DIAGNOSIS — J9621 Acute and chronic respiratory failure with hypoxia: Secondary | ICD-10-CM | POA: Diagnosis present

## 2022-07-22 DIAGNOSIS — J150 Pneumonia due to Klebsiella pneumoniae: Secondary | ICD-10-CM | POA: Diagnosis present

## 2022-07-22 DIAGNOSIS — E873 Alkalosis: Secondary | ICD-10-CM | POA: Diagnosis not present

## 2022-07-22 DIAGNOSIS — J9622 Acute and chronic respiratory failure with hypercapnia: Secondary | ICD-10-CM | POA: Diagnosis present

## 2022-07-22 DIAGNOSIS — J439 Emphysema, unspecified: Secondary | ICD-10-CM | POA: Diagnosis present

## 2022-07-22 DIAGNOSIS — E669 Obesity, unspecified: Secondary | ICD-10-CM | POA: Diagnosis not present

## 2022-07-22 DIAGNOSIS — J441 Chronic obstructive pulmonary disease with (acute) exacerbation: Secondary | ICD-10-CM

## 2022-07-22 DIAGNOSIS — Z1152 Encounter for screening for COVID-19: Secondary | ICD-10-CM

## 2022-07-22 LAB — CBC WITH DIFFERENTIAL/PLATELET
Abs Immature Granulocytes: 0.25 10*3/uL — ABNORMAL HIGH (ref 0.00–0.07)
Basophils Absolute: 0.1 10*3/uL (ref 0.0–0.1)
Basophils Relative: 0 %
Eosinophils Absolute: 0.3 10*3/uL (ref 0.0–0.5)
Eosinophils Relative: 1 %
HCT: 43.5 % (ref 36.0–46.0)
Hemoglobin: 13.2 g/dL (ref 12.0–15.0)
Immature Granulocytes: 1 %
Lymphocytes Relative: 25 %
Lymphs Abs: 7 10*3/uL — ABNORMAL HIGH (ref 0.7–4.0)
MCH: 21.4 pg — ABNORMAL LOW (ref 26.0–34.0)
MCHC: 30.3 g/dL (ref 30.0–36.0)
MCV: 70.4 fL — ABNORMAL LOW (ref 80.0–100.0)
Monocytes Absolute: 2.3 10*3/uL — ABNORMAL HIGH (ref 0.1–1.0)
Monocytes Relative: 8 %
Neutro Abs: 18.6 10*3/uL — ABNORMAL HIGH (ref 1.7–7.7)
Neutrophils Relative %: 65 %
Platelets: 378 10*3/uL (ref 150–400)
RBC: 6.18 MIL/uL — ABNORMAL HIGH (ref 3.87–5.11)
RDW: 18 % — ABNORMAL HIGH (ref 11.5–15.5)
Smear Review: NORMAL
WBC: 28.6 10*3/uL — ABNORMAL HIGH (ref 4.0–10.5)
nRBC: 0.1 % (ref 0.0–0.2)

## 2022-07-22 LAB — LACTIC ACID, PLASMA
Lactic Acid, Venous: 1.4 mmol/L (ref 0.5–1.9)
Lactic Acid, Venous: 1.6 mmol/L (ref 0.5–1.9)

## 2022-07-22 LAB — BASIC METABOLIC PANEL
Anion gap: 9 (ref 5–15)
BUN: 21 mg/dL (ref 8–23)
CO2: 29 mmol/L (ref 22–32)
Calcium: 9.2 mg/dL (ref 8.9–10.3)
Chloride: 103 mmol/L (ref 98–111)
Creatinine, Ser: 0.76 mg/dL (ref 0.44–1.00)
GFR, Estimated: >60 mL/min (ref >60)
Glucose, Bld: 131 mg/dL — ABNORMAL HIGH (ref 70–99)
Potassium: 4.2 mmol/L (ref 3.5–5.1)
Sodium: 141 mmol/L (ref 135–145)

## 2022-07-22 LAB — TROPONIN I (HIGH SENSITIVITY)
Troponin I (High Sensitivity): 11 ng/L (ref <18)
Troponin I (High Sensitivity): 11 ng/L (ref <18)

## 2022-07-22 LAB — MRSA NEXT GEN BY PCR, NASAL: MRSA by PCR Next Gen: NOT DETECTED

## 2022-07-22 LAB — HIV ANTIBODY (ROUTINE TESTING W REFLEX): HIV Screen 4th Generation wRfx: NONREACTIVE

## 2022-07-22 LAB — RESP PANEL BY RT-PCR (FLU A&B, COVID) ARPGX2
Influenza A by PCR: NEGATIVE
Influenza B by PCR: NEGATIVE
SARS Coronavirus 2 by RT PCR: NEGATIVE

## 2022-07-22 LAB — GLUCOSE, CAPILLARY: Glucose-Capillary: 149 mg/dL — ABNORMAL HIGH (ref 70–99)

## 2022-07-22 MED ORDER — CHLORHEXIDINE GLUCONATE CLOTH 2 % EX PADS
6.0000 | MEDICATED_PAD | Freq: Every day | CUTANEOUS | Status: DC
  Administered 2022-07-22 – 2022-08-03 (×13): 6 via TOPICAL

## 2022-07-22 MED ORDER — LORAZEPAM 2 MG/ML IJ SOLN
INTRAMUSCULAR | Status: AC
  Filled 2022-07-22: qty 1

## 2022-07-22 MED ORDER — METHYLPREDNISOLONE SODIUM SUCC 125 MG IJ SOLR: 100.0000 mg | Freq: Once | INTRAMUSCULAR | Status: AC

## 2022-07-22 MED ORDER — PANTOPRAZOLE SODIUM 40 MG IV SOLR
40.0000 mg | INTRAVENOUS | Status: DC
  Administered 2022-07-22 – 2022-07-27 (×6): 40 mg via INTRAVENOUS
  Filled 2022-07-22 (×6): qty 10

## 2022-07-22 MED ORDER — SODIUM CHLORIDE 0.9 % IV SOLN: 250.0000 mL | INTRAVENOUS | Status: DC | PRN

## 2022-07-22 MED ORDER — SODIUM CHLORIDE 0.9 % IV SOLN
500.0000 mg | Freq: Once | INTRAVENOUS | Status: DC
  Administered 2022-07-22: 500 mg via INTRAVENOUS
  Filled 2022-07-22: qty 5

## 2022-07-22 MED ORDER — SODIUM CHLORIDE 0.9 % IV BOLUS
1500.0000 mL | Freq: Once | INTRAVENOUS | Status: AC
  Administered 2022-07-22: 1500 mL via INTRAVENOUS

## 2022-07-22 MED ORDER — METHYLPREDNISOLONE SODIUM SUCC 40 MG IJ SOLR
20.0000 mg | Freq: Two times a day (BID) | INTRAMUSCULAR | Status: DC
  Administered 2022-07-22: 20 mg via INTRAVENOUS
  Filled 2022-07-22: qty 1

## 2022-07-22 MED ORDER — SODIUM CHLORIDE 0.9% FLUSH: 3.0000 mL | INTRAVENOUS | Status: DC | PRN

## 2022-07-22 MED ORDER — DOCUSATE SODIUM 100 MG PO CAPS: 100.0000 mg | ORAL_CAPSULE | Freq: Two times a day (BID) | ORAL | Status: DC | PRN

## 2022-07-22 MED ORDER — SODIUM CHLORIDE 0.9 % IV SOLN: 2.0000 g | INTRAVENOUS | Status: DC

## 2022-07-22 MED ORDER — LORAZEPAM 2 MG/ML IJ SOLN
2.0000 mg | INTRAMUSCULAR | Status: DC | PRN
  Administered 2022-07-23: 2 mg via INTRAVENOUS
  Filled 2022-07-22: qty 1

## 2022-07-22 MED ORDER — IPRATROPIUM-ALBUTEROL 0.5-2.5 (3) MG/3ML IN SOLN
3.0000 mL | RESPIRATORY_TRACT | Status: DC | PRN
  Administered 2022-07-27 – 2022-07-31 (×4): 3 mL via RESPIRATORY_TRACT
  Filled 2022-07-22 (×3): qty 3

## 2022-07-22 MED ORDER — SODIUM CHLORIDE 0.9 % IV SOLN
2.0000 g | INTRAVENOUS | Status: DC
  Administered 2022-07-23 – 2022-07-26 (×4): 2 g via INTRAVENOUS
  Filled 2022-07-22 (×4): qty 2

## 2022-07-22 MED ORDER — ORAL CARE MOUTH RINSE: 15.0000 mL | OROMUCOSAL | Status: DC | PRN

## 2022-07-22 MED ORDER — HYDROCORTISONE SOD SUC (PF) 100 MG IJ SOLR
INTRAMUSCULAR | Status: AC
  Administered 2022-07-22: 100 mg
  Filled 2022-07-22: qty 2

## 2022-07-22 MED ORDER — DEXMEDETOMIDINE HCL IN NACL 400 MCG/100ML IV SOLN
0.4000 ug/kg/h | INTRAVENOUS | Status: DC
  Administered 2022-07-22: 1 ug/kg/h via INTRAVENOUS
  Administered 2022-07-22: 0.8 ug/kg/h via INTRAVENOUS
  Administered 2022-07-23: 1 ug/kg/h via INTRAVENOUS
  Administered 2022-07-23 (×2): 0.8 ug/kg/h via INTRAVENOUS
  Filled 2022-07-22 (×4): qty 100

## 2022-07-22 MED ORDER — BUDESONIDE 0.5 MG/2ML IN SUSP
0.5000 mg | Freq: Two times a day (BID) | RESPIRATORY_TRACT | Status: DC
  Administered 2022-07-22 – 2022-07-27 (×10): 0.5 mg via RESPIRATORY_TRACT
  Filled 2022-07-22 (×11): qty 2

## 2022-07-22 MED ORDER — MORPHINE SULFATE (PF) 2 MG/ML IV SOLN
2.0000 mg | INTRAVENOUS | Status: DC | PRN
  Administered 2022-07-22 – 2022-07-26 (×3): 2 mg via INTRAVENOUS
  Filled 2022-07-22 (×5): qty 1

## 2022-07-22 MED ORDER — SODIUM CHLORIDE 0.9% FLUSH
3.0000 mL | Freq: Two times a day (BID) | INTRAVENOUS | Status: DC
  Administered 2022-07-22 – 2022-07-30 (×16): 3 mL via INTRAVENOUS

## 2022-07-22 MED ORDER — DEXMEDETOMIDINE HCL IN NACL 400 MCG/100ML IV SOLN
INTRAVENOUS | Status: AC
  Filled 2022-07-22: qty 100

## 2022-07-22 MED ORDER — METHYLPREDNISOLONE SODIUM SUCC 40 MG IJ SOLR
40.0000 mg | Freq: Two times a day (BID) | INTRAMUSCULAR | Status: DC
  Administered 2022-07-23 – 2022-07-27 (×9): 40 mg via INTRAVENOUS
  Filled 2022-07-22 (×9): qty 1

## 2022-07-22 MED ORDER — LORAZEPAM 2 MG/ML IJ SOLN
0.5000 mg | Freq: Once | INTRAMUSCULAR | Status: AC
  Administered 2022-07-22: 0.5 mg via INTRAVENOUS

## 2022-07-22 MED ORDER — IPRATROPIUM-ALBUTEROL 0.5-2.5 (3) MG/3ML IN SOLN
RESPIRATORY_TRACT | Status: AC
  Filled 2022-07-22: qty 6

## 2022-07-22 MED ORDER — ACETAMINOPHEN 325 MG PO TABS: 650.0000 mg | ORAL_TABLET | ORAL | Status: DC | PRN

## 2022-07-22 MED ORDER — IPRATROPIUM-ALBUTEROL 0.5-2.5 (3) MG/3ML IN SOLN
6.0000 mL | Freq: Once | RESPIRATORY_TRACT | Status: AC
  Administered 2022-07-22: 6 mL via RESPIRATORY_TRACT

## 2022-07-22 MED ORDER — LORAZEPAM 2 MG/ML IJ SOLN: 1.0000 mg | Freq: Once | INTRAMUSCULAR | Status: DC

## 2022-07-22 MED ORDER — ORAL CARE MOUTH RINSE
15.0000 mL | OROMUCOSAL | Status: DC
  Administered 2022-07-22 – 2022-07-25 (×14): 15 mL via OROMUCOSAL

## 2022-07-22 MED ORDER — BUDESONIDE 0.5 MG/2ML IN SUSP
0.5000 mg | Freq: Once | RESPIRATORY_TRACT | Status: AC
  Administered 2022-07-22: 0.5 mg via RESPIRATORY_TRACT
  Filled 2022-07-22: qty 2

## 2022-07-22 MED ORDER — ENOXAPARIN SODIUM 40 MG/0.4ML IJ SOSY
40.0000 mg | PREFILLED_SYRINGE | INTRAMUSCULAR | Status: DC
  Administered 2022-07-22 – 2022-07-23 (×2): 40 mg via SUBCUTANEOUS
  Filled 2022-07-22 (×2): qty 0.4

## 2022-07-22 MED ORDER — SODIUM CHLORIDE 0.9 % IV SOLN
1.0000 g | Freq: Once | INTRAVENOUS | Status: AC
  Administered 2022-07-22: 1 g via INTRAVENOUS
  Filled 2022-07-22: qty 10

## 2022-07-22 MED ORDER — POLYETHYLENE GLYCOL 3350 17 G PO PACK: 17.0000 g | PACK | Freq: Every day | ORAL | Status: DC | PRN

## 2022-07-22 MED ORDER — LORAZEPAM 2 MG/ML IJ SOLN
2.0000 mg | Freq: Once | INTRAMUSCULAR | Status: AC
  Administered 2022-07-22: 2 mg via INTRAVENOUS

## 2022-07-22 MED ORDER — SODIUM CHLORIDE 0.9 % IV SOLN
500.0000 mg | INTRAVENOUS | Status: DC
  Administered 2022-07-23 – 2022-07-25 (×3): 500 mg via INTRAVENOUS
  Filled 2022-07-22 (×2): qty 500
  Filled 2022-07-22: qty 5
  Filled 2022-07-22: qty 500

## 2022-07-22 MED ORDER — ONDANSETRON HCL 4 MG/2ML IJ SOLN: 4.0000 mg | Freq: Four times a day (QID) | INTRAMUSCULAR | Status: DC | PRN

## 2022-07-22 MED ORDER — IPRATROPIUM-ALBUTEROL 0.5-2.5 (3) MG/3ML IN SOLN
3.0000 mL | RESPIRATORY_TRACT | Status: DC
  Administered 2022-07-22 – 2022-07-27 (×31): 3 mL via RESPIRATORY_TRACT
  Filled 2022-07-22 (×32): qty 3

## 2022-07-22 NOTE — Progress Notes (Signed)
Pt transported from ER to ICU-10 on bipap with no complications

## 2022-07-22 NOTE — ED Notes (Signed)
Pt was a difficult IV stick. Multiple attempts by this RN and another RN to obtain blood cultures. IV antibiotics started to all blood cultures being obtained, due to difficult IV/blood draw.

## 2022-07-22 NOTE — Progress Notes (Signed)
   BRIEF PCCM NOTE  Pt was seen earlier by [facility] provider Dr. Belia Heman.  Please see his H&P for full assessment & plan.  BRIEF PT DESCRIPTION / SYNOPSIS :  61 yo obese female admitted with Acute Hypoxic Respiratory Failure in setting of RSV pneumonia & questionable superimposed CAP,along with severe COPD/Asthma exacerbation requiring BiPAP. High risk for intubation.  SUBJECTIVE / INTERVAL HISTORY :  -Called to bedside by nursing due to acute respiratory distress on BiPAP -Patient is awake and alert, however extremely restless and anxious with increased work of breathing and accessory muscle use -Upon auscultation, breath sounds severely diminished throughout -Patient was given bronchodilators, 100 mg Solu-Medrol x1, morphine, Ativan, and Precedex ~following these interventions patient is calm and relaxed and tolerating BiPAP with tidal volumes in the 450-500's range, able to decrease FiO2 to 40% -Patient looks much improved, however remains high risk for intubation -Patient's significant other was updated at bedside  OBJECTIVE :   Vitals:   07/22/22 1730 07/22/22 1800  BP: (!) 137/116 108/74  Pulse: (!) 130 100  Resp: (!) 24 18  Temp:    SpO2: 93% 100%     ASSESSMENT / PLAN :   Acute Hypoxic Respiratory Failure in the setting of  RSV pneumonia & questionable superimposed CAP,along with severe COPD/Asthma exacerbation -Supplemental O2 as needed to maintain O2 sats 88 to 92% -BiPAP, wean as tolerated ~high risk for intubation -Follow intermittent Chest X-ray & ABG as needed -Bronchodilators & Pulmicort nebs -IV Steroids (given additional 100 mg Solu-cortef x1, standing dose increased to 40 mg BID) -ABX for CAP coverage -Pulmonary toilet as able -Continue with Precedex, morphine, and Ativan as needed to help tolerate BiPAP       Pt's significant other updated at bedside.  Additional Critical Care Time: 30 minutes  Harlon Ditty, AGACNP-BC [facility] Prefer epic messenger for cross cover needs If after hours, please call E-link

## 2022-07-22 NOTE — ED Triage Notes (Signed)
Pt comes in via [facility] in respiratory distress x5 hours. Pt is RSV positive. Pt received 3 duo nebs  2g of mag 0.5 epi

## 2022-07-22 NOTE — ED Notes (Addendum)
Report given to ICU RN Corey Skains

## 2022-07-22 NOTE — H&P (Signed)
NAME:  Aimee Hooper, MRN:  [id number], DOB:  [birth year], LOS: 0 ADMISSION DATE:  07/22/2022  CHIEF COMPLAINT:  severe resp distress  BRIEF SYNOPSIS 61 yo obese female with RSV pneumonia, severe COPD exacerbation, RLL pneumonia SEPSIS POA  History of Present Illness:  61 y.o. female history of COPD and asthma not on home O2 per patient report who was diagnosed with RSV yesterday who presents to the emergency department in severe  respiratory distress.  extensive Wheezing, shortness of breath starting this morning.   Severe increased WOB and using accessory muscles to breathe   ER Course EMS personnel who report giving 125 IV Solu-Medrol, IV magnesium, 3 DuoNebs while in route to the emergency department. WBC 28 HBG 13 TROP 11  Placed on BiPAP    Pertinent  Medical History  COPD  Significant Hospital Events: Including procedures, antibiotic start and stop dates in addition to other pertinent events   Admitted to ICU for severe COPD exacerbation, on BiPAP     Micro Data:  +RSV COVID NEG  Antimicrobials:   Antibiotics Given (last 72 hours)     Date/Time Action Medication Dose Rate   07/22/22 0830 New Bag/Given   cefTRIAXone (ROCEPHIN) 1 g in sodium chloride 0.9 % 100 mL IVPB 1 g 200 mL/hr   07/22/22 0926 New Bag/Given   azithromycin (ZITHROMAX) 500 mg in sodium chloride 0.9 % 250 mL IVPB 500 mg 250 mL/hr               Objective   Blood pressure 104/67, pulse (!) 137, temperature 98.7 F (37.1 C), temperature source Oral, resp. rate (!) 23, height 5' (1.524 m), weight 77.1 kg, SpO2 97 %.    FiO2 (%):  [40 %] 40 %  No intake or output data in the 24 hours ending 07/22/22 1020 Filed Weights   07/22/22 0807  Weight: 77.1 kg     REVIEW OF SYSTEMS  PATIENT IS UNABLE TO PROVIDE COMPLETE REVIEW OF SYSTEMS DUE TO SEVERE CRITICAL ILLNESS   PHYSICAL EXAMINATION:  GENERAL:critically ill appearing, +resp distress EYES: Pupils equal, round, reactive  to light.  No scleral icterus.  MOUTH: Moist mucosal membrane.on biPAP NECK: Supple.  PULMONARY: Lungs clear to auscultation, +rhonchi, +wheezing CARDIOVASCULAR: S1 and S2.  Regular rate and rhythm GASTROINTESTINAL: Soft, nontender, -distended. Positive bowel sounds.  MUSCULOSKELETAL: No swelling, clubbing, or edema.  NEUROLOGIC: lethargic but arousable SKIN:normal, warm to touch, Capillary refill delayed  Pulses present bilaterally   Labs/imaging that I havepersonally reviewed  (right click and "Reselect all SmartList Selections" daily)      ASSESSMENT AND PLAN SYNOPSIS  61 yo obese female with acute and severe hypoxic and hypercapnic respiratory failure due to SEPSIS POA due to seveer RSV pneumonia with RLL bacterial pneumonia  Severe ACUTE Hypoxic and Hypercapnic Respiratory Failure On BIPAP HIGH RISK FOR INTUBATION FiO2 (%):  [40 %] 40 %   SEVERE COPD EXACERBATION -continue IV steroids as prescribed -continue NEB THERAPY as prescribed -morphine as needed -wean fio2 as needed and tolerated  CARDIAC ICU monitoring   RENAL -continue Foley Catheter-assess need -Avoid nephrotoxic agents -Follow urine output, BMP -Ensure adequate renal perfusion, optimize oxygenation -Renal dose medications  No intake or output data in the 24 hours ending 07/22/22 1020   NEUROLOGY metabolic encephalopathy-due to hypoxia arousable   INFECTIOUS DISEASE RSV pneumonia and RLL pneumonia -continue antibiotics as prescribed -follow up cultures   ENDO - ICU hypoglycemic\Hyperglycemia protocol -check FSBS per protocol   GI GI PROPHYLAXIS  as indicated  NUTRITIONAL STATUS DIET-->NPO Constipation protocol as indicated   ELECTROLYTES -follow labs as needed -replace as needed -pharmacy consultation and following   Best practice (right click and "Reselect all SmartList Selections" daily)  Diet: NPO DVT prophylaxis: LMWH GI prophylaxis: PPI Mobility:  bed rest  Code  Status:  FULL Disposition:ICU  Labs   CBC: Recent Labs  Lab 07/22/22 0757  WBC 28.6*  NEUTROABS 18.6*  HGB 13.2  HCT 43.5  MCV 70.4*  PLT 378    Basic Metabolic Panel: Recent Labs  Lab 07/22/22 0757  NA 141  K 4.2  CL 103  CO2 29  GLUCOSE 131*  BUN 21  CREATININE 0.76  CALCIUM 9.2   GFR: Estimated Creatinine Clearance: 66.9 mL/min (by C-G formula based on SCr of 0.76 mg/dL). Recent Labs  Lab 07/22/22 0757 07/22/22 0932  WBC 28.6*  --   LATICACIDVEN  --  1.4    Liver Function Tests: No results for input(s): "AST", "ALT", "ALKPHOS", "BILITOT", "PROT", "ALBUMIN" in the last 168 hours. No results for input(s): "LIPASE", "AMYLASE" in the last 168 hours. No results for input(s): "AMMONIA" in the last 168 hours.  ABG    Component Value Date/Time   HCO3 33.0 (H) 07/22/2022 0756   O2SAT 98.8 07/22/2022 0756     Coagulation Profile: No results for input(s): "INR", "PROTIME" in the last 168 hours.  Cardiac Enzymes: No results for input(s): "CKTOTAL", "CKMB", "CKMBINDEX", "TROPONINI" in the last 168 hours.  HbA1C: No results found for: "HGBA1C"  CBG: No results for input(s): "GLUCAP" in the last 168 hours.   Past Medical History:  She,  has a past medical history of Asthma, Emphysema lung (HCC), Obesity, and Stage 3 severe COPD by GOLD classification (HCC).   Surgical History:  History reviewed. No pertinent surgical history.   Social History:   reports that she has been smoking cigarettes. She has never used smokeless tobacco. She reports that she does not drink alcohol and does not use drugs.   Family History:  Her family history is not on file.   Allergies No Known Allergies   Home Medications  Prior to Admission medications   Medication Sig Start Date End Date Taking? Authorizing Provider  azithromycin (ZITHROMAX) 250 MG tablet Take 250 mg by mouth daily. 08/27/20  Yes [provider]  celecoxib (CELEBREX) 200 MG capsule Take 200 mg  by mouth daily. Patient not taking: Reported on 07/22/2022 06/26/20   [provider]  DIFFERIN 0.1 % cream Apply 1 application topically at bedtime. Patient not taking: Reported on 07/22/2022 06/26/20   [provider]  loratadine (CLARITIN) 10 MG tablet Take 10 mg by mouth daily. Patient not taking: Reported on 07/22/2022 06/26/20   [provider]  nicotine (NICODERM CQ - DOSED IN MG/24 HOURS) 21 mg/24hr patch Place 1 patch onto the skin daily. Patient not taking: Reported on 07/22/2022 07/24/20   [provider]  TRELEGY ELLIPTA 100-62.5-25 MCG/INH AEPB Take 1 puff by mouth daily. Patient not taking: Reported on 07/22/2022 08/06/20   [provider]       DVT/GI PRX  assessed I Assessed the need for Labs I Assessed the need for Foley I Assessed the need for Central Venous Line Family Discussion when available I Assessed the need for Mobilization I made an Assessment of medications to be adjusted accordingly Safety Risk assessment completed  CASE DISCUSSED IN MULTIDISCIPLINARY ROUNDS WITH ICU TEAM     Critical Care Time devoted to patient  care services described in this note is 75 minutes.   Critical care was necessary to treat /prevent imminent and life-threatening deterioration.  HIGH RISK FOR INTUBATION AND CARDIAC ARREST  I ANTICIPATE PROLONGED ICU LOS   Lucie Leather, M.D.  [facility]  Medical Director [facility] Medical Director [facility] Cardio-Pulmonary Department

## 2022-07-22 NOTE — ED Provider Notes (Signed)
[facility] Provider Note    Event Date/Time   First MD Initiated Contact with Patient 07/22/22 [phone]     (approximate)   History   Respiratory Distress   HPI  Aimee Hooper is a 61 y.o. female   Past medical history of COPD and asthma, not on home O2 per patient report, who was diagnosed with RSV yesterday who presents to the emergency department in respiratory distress.  Wheezing, shortness of breath starting this morning.  Denies chest pain.  Cough and no fever.    History was obtained via the patient.  Independent historian includes EMS personnel who report giving 125 IV Solu-Medrol, IV magnesium, 3 DuoNebs while in route to the emergency department.      Physical Exam   Triage Vital Signs: ED Triage Vitals  Enc Vitals Group     BP 07/22/22 0753 (!) 179/101     Pulse Rate 07/22/22 0753 (!) 145     Resp 07/22/22 0753 (!) 30     Temp 07/22/22 0753 98.7 F (37.1 C)     Temp Source 07/22/22 0753 Oral     SpO2 07/22/22 0753 96 %     Weight 07/22/22 0807 169 lb 15.6 oz (77.1 kg)     Height 07/22/22 0807 5' (1.524 m)     Head Circumference --      Peak Flow --      Pain Score --      Pain Loc --      Pain Edu? --      Excl. in GC? --     Most recent vital signs: Vitals:   07/22/22 0800 07/22/22 0804  BP:    Pulse: (!) 141   Resp: (!) 26   Temp:    SpO2: 96% 96%    General: Awake, alert and conversant in short sentences. CV:  Good peripheral perfusion.  Tachycardic 140. Resp:  Respiratory distress with diffuse wheezing throughout Abd:  No distention.     ED Results / Procedures / Treatments   Labs (all labs ordered are listed, but only abnormal results are displayed) Labs Reviewed  CBC WITH DIFFERENTIAL/PLATELET - Abnormal; Notable for the following components:      Result Value   WBC 28.6 (*)    RBC 6.18 (*)    MCV 70.4 (*)    MCH 21.4 (*)    RDW 18.0 (*)    All other components within normal limits  BASIC  METABOLIC PANEL - Abnormal; Notable for the following components:   Glucose, Bld 131 (*)    All other components within normal limits  BLOOD GAS, VENOUS - Abnormal; Notable for the following components:   pCO2, Ven 64 (*)    pO2, Ven 105 (*)    Bicarbonate 33.0 (*)    Acid-Base Excess 4.9 (*)    All other components within normal limits  RESP PANEL BY RT-PCR (FLU A&B, COVID) ARPGX2  CULTURE, BLOOD (ROUTINE X 2)  CULTURE, BLOOD (ROUTINE X 2)  LACTIC ACID, PLASMA  LACTIC ACID, PLASMA  TROPONIN I (HIGH SENSITIVITY)     I reviewed labs and they are notable for a venous blood gas with a pH of 7.32 and a PCO2 of 64  EKG  ED ECG REPORT I, Pilar Jarvis, the attending physician, personally viewed and interpreted this ECG.   Date: 07/22/2022  EKG Time: 0754  Rate: 146  Rhythm: sinus tachycardia  Axis: nl  Intervals:none  ST&T Change: No ischemic changes  RADIOLOGY I independently reviewed and interpreted chest x-ray and see diffuse bilateral opacities.   PROCEDURES:  Critical Care performed: Yes, see critical care procedure note(s)  .Critical Care  Performed by: Pilar Jarvis, MD Authorized by: Pilar Jarvis, MD   Critical care provider statement:    Critical care time (minutes):  30   Critical care was necessary to treat or prevent imminent or life-threatening deterioration of the following conditions:  Respiratory failure   Critical care was time spent personally by me on the following activities:  Development of treatment plan with patient or surrogate, evaluation of patient's response to treatment, examination of patient, ordering and review of laboratory studies, ordering and review of radiographic studies, ordering and performing treatments and interventions, pulse oximetry and re-evaluation of patient's condition    MEDICATIONS ORDERED IN ED: Medications  cefTRIAXone (ROCEPHIN) 1 g in sodium chloride 0.9 % 100 mL IVPB (1 g Intravenous New Bag/Given 07/22/22 0830)   azithromycin (ZITHROMAX) 500 mg in sodium chloride 0.9 % 250 mL IVPB (has no administration in time range)  sodium chloride 0.9 % bolus 1,500 mL (has no administration in time range)  LORazepam (ATIVAN) injection 0.5 mg (0.5 mg Intravenous Given 07/22/22 0755)  ipratropium-albuterol (DUONEB) 0.5-2.5 (3) MG/3ML nebulizer solution 6 mL (6 mLs Nebulization Given 07/22/22 0756)  budesonide (PULMICORT) nebulizer solution 0.5 mg (0.5 mg Nebulization Given 07/22/22 0824)    Consultants:  I spoke with hospitalist re admission & regarding care plan for this patient.   IMPRESSION / MDM / ASSESSMENT AND PLAN / ED COURSE  I reviewed the triage vital signs and the nursing notes.                              Differential diagnosis includes, but is not limited to, COPD exacerbation, asthma exacerbation, viral pneumonia, bacterial pneumonia, pneumothorax, hypoxemic or hypercapnic respiratory failure, sepsis, metabolic derangement/AKI   The patient is on the cardiac monitor to evaluate for evidence of arrhythmia and/or significant heart rate changes.  MDM: Patient with COPD and severe respiratory distress after positive RSV testing yesterday we will treat for severe COPD exacerbation by immediately placing on BiPAP, giving additional DuoNeb's x2, respiratory steroids, and community-acquired pneumonia coverage.  She got a small dose of IV Ativan to help with anxiety.  She already received 125 mg of IV Solu-Medrol as well as IV magnesium by EMS personnel.  She already feels improved on BiPAP.  Her VBG is remarkably well-appearing.  Will maintain on BiPAP and admit to hospitalist service for further management of COPD exacerbation.  Patient has an elevated white blood cell count in the 20s, remains tachycardic 140, an official chest x-ray read shows basilar opacity concerning for pneumonia.  Will obtain lactic acid and blood cultures, given respiratory antibiotics, give 30 cc/kg sepsis bolus per ideal body  weight now.  I consulted with ICU for disposition.   Patient's presentation is most consistent with acute presentation with potential threat to life or bodily function.       FINAL CLINICAL IMPRESSION(S) / ED DIAGNOSES   Final diagnoses:  Pneumonia due to infectious organism, unspecified laterality, unspecified part of lung     Rx / DC Orders   ED Discharge Orders     None        Note:  This document was prepared using Dragon voice recognition software and may include unintentional dictation errors.    Pilar Jarvis, MD 07/22/22 [phone]

## 2022-07-22 NOTE — Progress Notes (Signed)
Pt became suddenly restless and agitated this evening stating "I need to take this mask off". Pt extremely SOB, working hard to breath, and refusing morphine. RN educated pt and tried to relax pt. RT to bedside with breathing treatment. ICU NP to bedside and precedex started. Pt continues to be SOB, and anxious. Ativan ordered and administered with pts consent. Pt appears to be resting comfortably at this time. Will continue to monitor.

## 2022-07-22 NOTE — ED Notes (Signed)
Informed RN bed assigned 

## 2022-07-22 NOTE — ED Notes (Signed)
Report given to Corey Skains, RN

## 2022-07-23 ENCOUNTER — Other Ambulatory Visit: Payer: Self-pay

## 2022-07-23 ENCOUNTER — Inpatient Hospital Stay: Payer: Medicaid Other

## 2022-07-23 ENCOUNTER — Other Ambulatory Visit: Payer: Medicaid Other

## 2022-07-23 DIAGNOSIS — J9601 Acute respiratory failure with hypoxia: Secondary | ICD-10-CM | POA: Diagnosis not present

## 2022-07-23 DIAGNOSIS — J189 Pneumonia, unspecified organism: Secondary | ICD-10-CM

## 2022-07-23 LAB — BLOOD GAS, ARTERIAL
Acid-Base Excess: 2.7 mmol/L — ABNORMAL HIGH (ref 0.0–2.0)
Bicarbonate: 28.9 mmol/L — ABNORMAL HIGH (ref 20.0–28.0)
FIO2: 60 %
MECHVT: 400 mL
Mechanical Rate: 16
PEEP: 5 cmH2O
Patient temperature: 37
pCO2 arterial: 50 mmHg — ABNORMAL HIGH (ref 32–48)
pH, Arterial: 7.37 (ref 7.35–7.45)
pO2, Arterial: 179 mmHg — ABNORMAL HIGH (ref 83–108)

## 2022-07-23 LAB — BLOOD GAS, VENOUS
Acid-Base Excess: 4.9 mmol/L — ABNORMAL HIGH (ref 0.0–2.0)
Bicarbonate: 33 mmol/L — ABNORMAL HIGH (ref 20.0–28.0)
O2 Saturation: 98.8 %
Patient temperature: 37
pCO2, Ven: 64 mmHg — ABNORMAL HIGH (ref 44–60)
pH, Ven: 7.32 (ref 7.25–7.43)
pO2, Ven: 105 mmHg — ABNORMAL HIGH (ref 32–45)

## 2022-07-23 LAB — CBC
HCT: 39.3 % (ref 36.0–46.0)
Hemoglobin: 11.7 g/dL — ABNORMAL LOW (ref 12.0–15.0)
MCH: 21.2 pg — ABNORMAL LOW (ref 26.0–34.0)
MCHC: 29.8 g/dL — ABNORMAL LOW (ref 30.0–36.0)
MCV: 71.2 fL — ABNORMAL LOW (ref 80.0–100.0)
Platelets: 251 10*3/uL (ref 150–400)
RBC: 5.52 MIL/uL — ABNORMAL HIGH (ref 3.87–5.11)
RDW: 17.4 % — ABNORMAL HIGH (ref 11.5–15.5)
WBC: 11.8 10*3/uL — ABNORMAL HIGH (ref 4.0–10.5)
nRBC: 0 % (ref 0.0–0.2)

## 2022-07-23 LAB — BASIC METABOLIC PANEL
Anion gap: 7 (ref 5–15)
BUN: 17 mg/dL (ref 8–23)
CO2: 30 mmol/L (ref 22–32)
Calcium: 8.9 mg/dL (ref 8.9–10.3)
Chloride: 106 mmol/L (ref 98–111)
Creatinine, Ser: 0.46 mg/dL (ref 0.44–1.00)
GFR, Estimated: >60 mL/min (ref >60)
Glucose, Bld: 125 mg/dL — ABNORMAL HIGH (ref 70–99)
Potassium: 4.6 mmol/L (ref 3.5–5.1)
Sodium: 143 mmol/L (ref 135–145)

## 2022-07-23 LAB — MAGNESIUM: Magnesium: 2.8 mg/dL — ABNORMAL HIGH (ref 1.7–2.4)

## 2022-07-23 LAB — PHOSPHORUS: Phosphorus: 4.3 mg/dL (ref 2.5–4.6)

## 2022-07-23 MED ORDER — DOCUSATE SODIUM 50 MG/5ML PO LIQD
100.0000 mg | Freq: Two times a day (BID) | ORAL | Status: DC
  Administered 2022-07-23 – 2022-07-28 (×8): 100 mg
  Filled 2022-07-23 (×8): qty 10

## 2022-07-23 MED ORDER — PROPOFOL 1000 MG/100ML IV EMUL
5.0000 ug/kg/min | INTRAVENOUS | Status: DC
  Administered 2022-07-23: 10 ug/kg/min via INTRAVENOUS
  Administered 2022-07-24 (×3): 30 ug/kg/min via INTRAVENOUS
  Administered 2022-07-24: 35 ug/kg/min via INTRAVENOUS
  Administered 2022-07-25: 45 ug/kg/min via INTRAVENOUS
  Administered 2022-07-25: 15 ug/kg/min via INTRAVENOUS
  Administered 2022-07-25: 45 ug/kg/min via INTRAVENOUS
  Administered 2022-07-25: 30 ug/kg/min via INTRAVENOUS
  Administered 2022-07-25 – 2022-07-26 (×2): 45 ug/kg/min via INTRAVENOUS
  Administered 2022-07-26: 55 ug/kg/min via INTRAVENOUS
  Administered 2022-07-26 (×2): 45 ug/kg/min via INTRAVENOUS
  Administered 2022-07-27: 30 ug/kg/min via INTRAVENOUS
  Administered 2022-07-27 (×2): 50 ug/kg/min via INTRAVENOUS
  Filled 2022-07-23 (×19): qty 100

## 2022-07-23 MED ORDER — SODIUM CHLORIDE 0.9 % IV SOLN
250.0000 mL | INTRAVENOUS | Status: DC
  Administered 2022-07-24: 250 mL via INTRAVENOUS

## 2022-07-23 MED ORDER — NOREPINEPHRINE 4 MG/250ML-% IV SOLN
2.0000 ug/min | INTRAVENOUS | Status: DC
  Administered 2022-07-23: 2 ug/min via INTRAVENOUS
  Administered 2022-07-24: 6 ug/min via INTRAVENOUS
  Filled 2022-07-23 (×3): qty 250

## 2022-07-23 MED ORDER — LACTATED RINGERS IV BOLUS
1000.0000 mL | Freq: Once | INTRAVENOUS | Status: AC
  Administered 2022-07-23: 1000 mL via INTRAVENOUS

## 2022-07-23 MED ORDER — LORAZEPAM 2 MG/ML IJ SOLN
1.0000 mg | INTRAMUSCULAR | Status: DC | PRN
  Administered 2022-07-23 – 2022-07-28 (×9): 1 mg via INTRAVENOUS
  Filled 2022-07-23 (×9): qty 1

## 2022-07-23 MED ORDER — ROCURONIUM BROMIDE 10 MG/ML (PF) SYRINGE
85.0000 mg | PREFILLED_SYRINGE | Freq: Once | INTRAVENOUS | Status: AC
  Administered 2022-07-23: 85 mg via INTRAVENOUS

## 2022-07-23 MED ORDER — POLYETHYLENE GLYCOL 3350 17 G PO PACK
17.0000 g | PACK | Freq: Every day | ORAL | Status: DC
  Administered 2022-07-24 – 2022-07-27 (×3): 17 g
  Filled 2022-07-23 (×3): qty 1

## 2022-07-23 MED ORDER — ROCURONIUM BROMIDE 10 MG/ML (PF) SYRINGE
PREFILLED_SYRINGE | INTRAVENOUS | Status: AC
  Filled 2022-07-23: qty 10

## 2022-07-23 MED ORDER — FENTANYL 2500MCG IN NS 250ML (10MCG/ML) PREMIX INFUSION
0.0000 ug/h | INTRAVENOUS | Status: DC
  Administered 2022-07-23: 50 ug/h via INTRAVENOUS
  Administered 2022-07-24 – 2022-07-25 (×3): 200 ug/h via INTRAVENOUS
  Administered 2022-07-25: 15 ug/h via INTRAVENOUS
  Filled 2022-07-23 (×5): qty 250

## 2022-07-23 MED ORDER — ETOMIDATE 2 MG/ML IV SOLN
20.0000 mg | Freq: Once | INTRAVENOUS | Status: AC
  Administered 2022-07-23: 20 mg via INTRAVENOUS

## 2022-07-23 MED ORDER — FENTANYL CITRATE PF 50 MCG/ML IJ SOSY: 50.0000 ug | PREFILLED_SYRINGE | Freq: Once | INTRAMUSCULAR | Status: DC

## 2022-07-23 MED ORDER — ETOMIDATE 2 MG/ML IV SOLN
INTRAVENOUS | Status: AC
  Filled 2022-07-23: qty 20

## 2022-07-23 MED ORDER — FENTANYL CITRATE (PF) 100 MCG/2ML IJ SOLN
INTRAMUSCULAR | Status: AC
  Filled 2022-07-23: qty 2

## 2022-07-23 MED ORDER — FENTANYL CITRATE (PF) 100 MCG/2ML IJ SOLN
100.0000 ug | Freq: Once | INTRAMUSCULAR | Status: AC
  Administered 2022-07-23: 100 ug via INTRAVENOUS

## 2022-07-23 MED ORDER — FENTANYL BOLUS VIA INFUSION
50.0000 ug | INTRAVENOUS | Status: DC | PRN
  Administered 2022-07-23 (×2): 100 ug via INTRAVENOUS

## 2022-07-23 NOTE — Progress Notes (Signed)
NAME:  Aimee Hooper, MRN:  [id number], DOB:  [birth year], LOS: 1 ADMISSION DATE:  07/22/2022  CHIEF COMPLAINT:  severe resp distress  BRIEF SYNOPSIS 61 yo obese female with RSV pneumonia, severe COPD exacerbation, RLL pneumonia SEPSIS POA  History of Present Illness:  61 y.o. female history of COPD and asthma not on home O2 per patient report who was diagnosed with RSV yesterday who presents to the emergency department in severe  respiratory distress.  extensive Wheezing, shortness of breath starting this morning.   Severe increased WOB and using accessory muscles to breathe   ER Course EMS personnel who report giving 125 IV Solu-Medrol, IV magnesium, 3 DuoNebs while in route to the emergency department. WBC 28 HBG 13 TROP 11  Placed on BiPAP    Pertinent  Medical History  Stage 3 Severe COPD Asthma  Obesity  Emphysema of Lung   Significant Hospital Events: Including procedures, antibiotic start and stop dates in addition to other pertinent events   11/15: Admitted to ICU for severe COPD exacerbation, on BiPAP 11/16: Pt remains on Bipap @40 % requiring precedex gtt and prn ativan due to intermittent anxiety/agitation   Micro Data:  Resp Panel by RT-PCR (Flu A&B, Covid) 11/15>>negative  MRSA PCR 11/15>>negative  Blood x2 11/15>>  Antimicrobials:   Antibiotics Given (last 72 hours)     Date/Time Action Medication Dose Rate   07/22/22 0830 New Bag/Given   cefTRIAXone (ROCEPHIN) 1 g in sodium chloride 0.9 % 100 mL IVPB 1 g 200 mL/hr   07/22/22 0926 New Bag/Given   azithromycin (ZITHROMAX) 500 mg in sodium chloride 0.9 % 250 mL IVPB 500 mg 250 mL/hr      Subjective:   As outlined above under significant events   Objective   Blood pressure 101/70, pulse 74, temperature 98.7 F (37.1 C), temperature source Axillary, resp. rate 16, height 5' (1.524 m), weight 87.6 kg, SpO2 98 %.    FiO2 (%):  [35 %-40 %] 40 %   Intake/Output Summary (Last 24 hours) at  07/23/2022 0806 Last data filed at 07/23/2022 0654 Gross per 24 hour  Intake 608.08 ml  Output 850 ml  Net -241.92 ml   Filed Weights   07/22/22 0807 07/22/22 1037 07/23/22 0500  Weight: 77.1 kg 90.2 kg 87.6 kg    Labs/imaging that I have personally reviewed  (right click and "Reselect all SmartList Selections" daily)   Examination: General: Acute on chronically ill-appearing female , mild respiratory distress on Bipap HENT: Atraumatic, normocephalic, neck supple, no JVD Lungs: Diminished with expiratory wheezes throughout, mildly labored with accessory muscle use  Cardiovascular: NSR, rrr, no M/R/G, 2+ radial/1+ distal pulses, no edema  Abdomen: +BS x4, soft, non distended, obese   Extremities: Generalized weakness, no edema, no cyanosis, warm extremities Neuro: Sedated not following commands, bilateral pupils pinpoint sluggish  GU: Purewick intact draining clear yellow urine   Synopsis  61 yo obese female with acute and severe hypoxic and hypercapnic respiratory failure due to SEPSIS POA due to seveer RSV pneumonia with RLL bacterial pneumonia  ASSESSMENT AND PLAN Acute Hypoxic Respiratory Failure in the setting of  RSV pneumonia & questionable superimposed CAP,along with severe COPD/Asthma exacerbation - Supplemental O2 as needed to maintain O2 sats 88 to 92% - BiPAP, wean as tolerated  - Follow intermittent Chest X-ray & ABG as needed - Continue bronchodilators & pulmicort nebs - Continue IV steroids 40 mg q12hr wean as tolerated  - ABX for CAP coverage - Pulmonary toilet as  able - Continue with Precedex, morphine, and Ativan as needed to help tolerate BiPAP - HIGH RISK FOR MECHANICAL INTUBATION    Mild anemia without obvious signs of bleeding  - Trend CBC - Monitor for s/sx of bleeding and transfuse for hgb <7   Best practice (right click and "Reselect all SmartList Selections" daily)  Diet: NPO DVT prophylaxis: LMWH GI prophylaxis: PPI Mobility:  bed rest  Code  Status:  FULL Disposition: ICU  Will update family when they arrive at bedside  Labs   CBC: Recent Labs  Lab 07/22/22 0757 07/23/22 0710  WBC 28.6* 11.8*  NEUTROABS 18.6*  --   HGB 13.2 11.7*  HCT 43.5 39.3  MCV 70.4* 71.2*  PLT 378 251    Basic Metabolic Panel: Recent Labs  Lab 07/22/22 0757 07/23/22 0710  NA 141 143  K 4.2 4.6  CL 103 106  CO2 29 30  GLUCOSE 131* 125*  BUN 21 17  CREATININE 0.76 0.46  CALCIUM 9.2 8.9  MG  --  2.8*  PHOS  --  4.3   GFR: Estimated Creatinine Clearance: 71.7 mL/min (by C-G formula based on SCr of 0.46 mg/dL). Recent Labs  Lab 07/22/22 0757 07/22/22 0932 07/22/22 1119 07/23/22 0710  WBC 28.6*  --   --  11.8*  LATICACIDVEN  --  1.4 1.6  --     Liver Function Tests: No results for input(s): "AST", "ALT", "ALKPHOS", "BILITOT", "PROT", "ALBUMIN" in the last 168 hours. No results for input(s): "LIPASE", "AMYLASE" in the last 168 hours. No results for input(s): "AMMONIA" in the last 168 hours.  ABG    Component Value Date/Time   HCO3 33.0 (H) 07/22/2022 0756   O2SAT 98.8 07/22/2022 0756     Coagulation Profile: No results for input(s): "INR", "PROTIME" in the last 168 hours.  Cardiac Enzymes: No results for input(s): "CKTOTAL", "CKMB", "CKMBINDEX", "TROPONINI" in the last 168 hours.  HbA1C: No results found for: "HGBA1C"  CBG: Recent Labs  Lab 07/22/22 1024  GLUCAP 149*   Review of Symptoms:  Unable to assess pt remains on Bipap   Past Medical History:  She,  has a past medical history of Asthma, Emphysema lung (HCC), Obesity, and Stage 3 severe COPD by GOLD classification (HCC).   Surgical History:  History reviewed. No pertinent surgical history.   Social History:   reports that she has been smoking cigarettes. She has never used smokeless tobacco. She reports that she does not drink alcohol and does not use drugs.   Family History:  Her family history is not on file.   Allergies No Known Allergies    Home Medications  Prior to Admission medications   Medication Sig Start Date End Date Taking? Authorizing Provider  azithromycin (ZITHROMAX) 250 MG tablet Take 250 mg by mouth daily. 08/27/20  Yes [provider]  celecoxib (CELEBREX) 200 MG capsule Take 200 mg by mouth daily. Patient not taking: Reported on 07/22/2022 06/26/20   [provider]  DIFFERIN 0.1 % cream Apply 1 application topically at bedtime. Patient not taking: Reported on 07/22/2022 06/26/20   [provider]  loratadine (CLARITIN) 10 MG tablet Take 10 mg by mouth daily. Patient not taking: Reported on 07/22/2022 06/26/20   [provider]  nicotine (NICODERM CQ - DOSED IN MG/24 HOURS) 21 mg/24hr patch Place 1 patch onto the skin daily. Patient not taking: Reported on 07/22/2022 07/24/20   [provider]  TRELEGY ELLIPTA 100-62.5-25 MCG/INH AEPB Take 1 puff by  mouth daily. Patient not taking: Reported on 07/22/2022 08/06/20   [provider]       Critical Care Time: 35 minutes  Zada Girt, AGNP  Pulmonary/Critical Care Pager [phone] (please enter 7 digits) PCCM Consult Pager [phone] (please enter 7 digits)

## 2022-07-23 NOTE — IPAL (Signed)
  Interdisciplinary Goals of Care Family Meeting   Date carried out: 07/23/2022  Location of the meeting: Bedside  Member's involved: Nurse Practitioner, Bedside Registered Nurse, and Family Member or next of kin  Durable Power of Attorney or acting medical decision maker: Pt and Pts son Erielle Gawronski     Discussion: We discussed goals of care for [employer] .    Code status: Full Code  Disposition: Continue current acute care; pt stated if required she would want to proceed with tracheostomy if unable to be liberated from mechanical ventilation   Time spent for the meeting: 30 minutes    Zada Girt, AGNP  Pulmonary/Critical Care Pager [phone] (please enter 7 digits) PCCM Consult Pager [phone] (please enter 7 digits)

## 2022-07-23 NOTE — Procedures (Signed)
Endotracheal Intubation: Patient required placement of an artificial airway secondary to unresponsive state   Consent: Emergent.    Hand washing performed prior to starting the procedure.    Medications administered for sedation prior to procedure:  Fentanyl 100 mcg IV, 20 mg Etomidate IV, Rocuronium 50 mg IV     A time out procedure was called and correct patient, name, & ID confirmed. Needed supplies and equipment were assembled and checked to include ETT, 10 ml syringe, Glidescope, Mac and Miller blades, suction, oxygen and bag mask valve, end tidal CO2 monitor.    Patient was positioned to align the mouth and pharynx to facilitate visualization of the glottis.    Heart rate, SpO2 and blood pressure was continuously monitored during the procedure. Pre-oxygenation was conducted prior to intubation and endotracheal tube was placed through the vocal cords into the trachea.       The artificial airway was placed under direct visualization via glidescope route using a 8 cm ETT on the first attempt.   ETT was secured at 22 cm .   Placement was confirmed by auscuitation of lungs with good breath sounds bilaterally and no stomach sounds.  Condensation was noted on endotracheal tube.   Pulse ox 100% CO2 detector in place with appropriate color change.    Complications: None .        Chest radiograph ordered and pending.   Donell Beers, [place] Pager [phone] (please enter 7 digits) PCCM Consult Pager [phone] (please enter 7 digits)

## 2022-07-23 NOTE — Progress Notes (Signed)
Ultrasound PIV started @ depth greater than . Unable to find anything more superficial.

## 2022-07-24 DIAGNOSIS — J9622 Acute and chronic respiratory failure with hypercapnia: Secondary | ICD-10-CM | POA: Diagnosis not present

## 2022-07-24 DIAGNOSIS — J9621 Acute and chronic respiratory failure with hypoxia: Secondary | ICD-10-CM | POA: Diagnosis not present

## 2022-07-24 DIAGNOSIS — J189 Pneumonia, unspecified organism: Secondary | ICD-10-CM | POA: Diagnosis not present

## 2022-07-24 LAB — BASIC METABOLIC PANEL
Anion gap: 5 (ref 5–15)
BUN: 24 mg/dL — ABNORMAL HIGH (ref 8–23)
CO2: 30 mmol/L (ref 22–32)
Calcium: 8.8 mg/dL — ABNORMAL LOW (ref 8.9–10.3)
Chloride: 107 mmol/L (ref 98–111)
Creatinine, Ser: 0.61 mg/dL (ref 0.44–1.00)
GFR, Estimated: >60 mL/min (ref >60)
Glucose, Bld: 147 mg/dL — ABNORMAL HIGH (ref 70–99)
Potassium: 4.4 mmol/L (ref 3.5–5.1)
Sodium: 142 mmol/L (ref 135–145)

## 2022-07-24 LAB — CBC
HCT: 40.7 % (ref 36.0–46.0)
Hemoglobin: 12.1 g/dL (ref 12.0–15.0)
MCH: 21.3 pg — ABNORMAL LOW (ref 26.0–34.0)
MCHC: 29.7 g/dL — ABNORMAL LOW (ref 30.0–36.0)
MCV: 71.7 fL — ABNORMAL LOW (ref 80.0–100.0)
Platelets: 296 10*3/uL (ref 150–400)
RBC: 5.68 MIL/uL — ABNORMAL HIGH (ref 3.87–5.11)
RDW: 17.2 % — ABNORMAL HIGH (ref 11.5–15.5)
WBC: 15.1 10*3/uL — ABNORMAL HIGH (ref 4.0–10.5)
nRBC: 0 % (ref 0.0–0.2)

## 2022-07-24 LAB — HEPATIC FUNCTION PANEL
ALT: 44 U/L (ref 0–44)
AST: 36 U/L (ref 15–41)
Albumin: 3.4 g/dL — ABNORMAL LOW (ref 3.5–5.0)
Alkaline Phosphatase: 57 U/L (ref 38–126)
Bilirubin, Direct: 0.1 mg/dL (ref 0.0–0.2)
Indirect Bilirubin: 0.7 mg/dL (ref 0.3–0.9)
Total Bilirubin: 0.8 mg/dL (ref 0.3–1.2)
Total Protein: 6.5 g/dL (ref 6.5–8.1)

## 2022-07-24 LAB — GLUCOSE, CAPILLARY
Glucose-Capillary: 122 mg/dL — ABNORMAL HIGH (ref 70–99)
Glucose-Capillary: 147 mg/dL — ABNORMAL HIGH (ref 70–99)
Glucose-Capillary: 127 mg/dL — ABNORMAL HIGH (ref 70–99)
Glucose-Capillary: 144 mg/dL — ABNORMAL HIGH (ref 70–99)

## 2022-07-24 LAB — PHOSPHORUS: Phosphorus: 3.9 mg/dL (ref 2.5–4.6)

## 2022-07-24 LAB — TRIGLYCERIDES: Triglycerides: 234 mg/dL — ABNORMAL HIGH (ref <150)

## 2022-07-24 LAB — MAGNESIUM: Magnesium: 2.3 mg/dL (ref 1.7–2.4)

## 2022-07-24 MED ORDER — FREE WATER
30.0000 mL | Status: DC
  Administered 2022-07-24 – 2022-07-30 (×34): 30 mL

## 2022-07-24 MED ORDER — ADULT MULTIVITAMIN LIQUID CH
15.0000 mL | Freq: Every day | ORAL | Status: DC
  Administered 2022-07-25 – 2022-07-28 (×4): 15 mL
  Filled 2022-07-24 (×4): qty 15

## 2022-07-24 MED ORDER — NICOTINE 14 MG/24HR TD PT24
14.0000 mg | MEDICATED_PATCH | Freq: Every day | TRANSDERMAL | Status: DC
  Administered 2022-07-24 – 2022-07-26 (×3): 14 mg via TRANSDERMAL
  Filled 2022-07-24 (×3): qty 1

## 2022-07-24 MED ORDER — DIAZEPAM 5 MG PO TABS
5.0000 mg | ORAL_TABLET | Freq: Four times a day (QID) | ORAL | Status: DC
  Administered 2022-07-24 – 2022-07-27 (×13): 5 mg
  Filled 2022-07-24 (×13): qty 1

## 2022-07-24 MED ORDER — ENOXAPARIN SODIUM 60 MG/0.6ML IJ SOSY
0.5000 mg/kg | PREFILLED_SYRINGE | INTRAMUSCULAR | Status: DC
  Administered 2022-07-24 – 2022-07-26 (×3): 45 mg via SUBCUTANEOUS
  Filled 2022-07-24 (×3): qty 0.6

## 2022-07-24 MED ORDER — LACTATED RINGERS IV SOLN: INTRAVENOUS | Status: DC

## 2022-07-24 MED ORDER — OXYCODONE HCL 5 MG PO TABS
5.0000 mg | ORAL_TABLET | Freq: Four times a day (QID) | ORAL | Status: DC
  Administered 2022-07-24 – 2022-07-29 (×20): 5 mg
  Filled 2022-07-24 (×20): qty 1

## 2022-07-24 MED ORDER — VITAL HIGH PROTEIN PO LIQD
1000.0000 mL | ORAL | Status: DC
  Administered 2022-07-24 – 2022-07-28 (×5): 1000 mL

## 2022-07-24 NOTE — Progress Notes (Signed)
PHARMACY CONSULT NOTE  Pharmacy Consult for Electrolyte Monitoring and Replacement   Recent Labs: Potassium (mmol/L)  Date Value  07/24/2022 4.4   Magnesium (mg/dL)  Date Value  59/16/3846 2.3   Calcium (mg/dL)  Date Value  65/99/3570 8.8 (L)   Albumin (g/dL)  Date Value  17/79/3903 3.4 (L)   Phosphorus (mg/dL)  Date Value  00/92/3300 3.9   Sodium (mmol/L)  Date Value  07/24/2022 142     Assessment: 61 y/o female with h/o COPD who is admitted with COPD exacerbation, RSV and CAP.   Pt sedated and ventilated. OGT in place. Plan is to start tube feeds today.   Goal of Therapy:  Electrolytes WNL  Plan:  --no electrolyte replacement warranted for today --recheck electrolytes in am  Lowella Bandy ,PharmD Clinical Pharmacist 07/24/2022 7:28 AM

## 2022-07-24 NOTE — Progress Notes (Signed)
 NAME:  Aimee Hooper, MRN:  [id number], DOB:  01/13/1961, LOS: 2 ADMISSION DATE:  07/22/2022  CHIEF COMPLAINT:  severe resp distress  BRIEF SYNOPSIS 61 yo obese female with RSV pneumonia, severe COPD exacerbation, RLL pneumonia SEPSIS POA  History of Present Illness:  61 y.o. female history of COPD and asthma not on home O2 per patient report who was diagnosed with RSV yesterday who presents to the emergency department in severe  respiratory distress.  extensive Wheezing, shortness of breath starting this morning.   Severe increased WOB and using accessory muscles to breathe   ER Course EMS personnel who report giving 125 IV Solu-Medrol, IV magnesium, 3 DuoNebs while in route to the emergency department. WBC 28 HBG 13 TROP 11  Placed on BiPAP.  PCCM asked to admit for further workup and treatment.   Please see "significant hospital events" section below for full detailed hospital course.    Pertinent  Medical History  Stage 3 Severe COPD Asthma  Obesity  Emphysema of Lung   Significant Hospital Events: Including procedures, antibiotic start and stop dates in addition to other pertinent events   11/15: Admitted to ICU for severe COPD exacerbation, on BiPAP 11/16: Pt remains on Bipap @40% requiring precedex gtt and prn ativan due to intermittent anxiety/agitation.  Ultimately required INTUBATION & MECHANICAL VENTILATION 11/17:  Requiring low dose peripheral Levophed due to sedation.  Lung sounds remain diminished with slight wheeze, will rest on vent today with no SBT.  Micro Data:  Resp Panel by RT-PCR (Flu A&B, Covid) 11/15>>negative  MRSA PCR 11/15>>negative  Blood x2 11/15>> no growth to date Tracheal aspirate 11/16>>  Antimicrobials:   Antibiotics Given (last 72 hours)     Date/Time Action Medication Dose Rate   07/22/22 0830 New Bag/Given   cefTRIAXone (ROCEPHIN) 1 g in sodium chloride 0.9 % 100 mL IVPB 1 g 200 mL/hr   07/22/22 0926 New Bag/Given    azithromycin (ZITHROMAX) 500 mg in sodium chloride 0.9 % 250 mL IVPB 500 mg 250 mL/hr   07/23/22 1006 New Bag/Given   cefTRIAXone (ROCEPHIN) 2 g in sodium chloride 0.9 % 100 mL IVPB 2 g 200 mL/hr   07/23/22 1130 New Bag/Given   azithromycin (ZITHROMAX) 500 mg in sodium chloride 0.9 % 250 mL IVPB 500 mg 250 mL/hr   07/24/22 1056 New Bag/Given   cefTRIAXone (ROCEPHIN) 2 g in sodium chloride 0.9 % 100 mL IVPB 2 g 200 mL/hr   07/24/22 1131 New Bag/Given   azithromycin (ZITHROMAX) 500 mg in sodium chloride 0.9 % 250 mL IVPB 500 mg 250 mL/hr      Subjective:   -Yesterday evening required intubation and mechanical ventilation -With sedation has required initiation of low-dose peripheral Levophed -Afebrile, hemodynamically stable, minimal vent settings (40% FiO2 and 5 of PEEP) -Lung sounds remain diminished throughout with mild expiratory wheezing, given intubation yesterday evening will rest on ventilator today with no SBT  Objective   Blood pressure (!) 101/54, pulse 70, temperature 98 F (36.7 C), temperature source Axillary, resp. rate 15, height 5' (1.524 m), weight 87.6 kg, SpO2 93 %.    Vent Mode: PRVC FiO2 (%):  [32 %-60 %] 40 % Set Rate:  [16 bmp] 16 bmp Vt Set:  [400 mL] 400 mL PEEP:  [5 cmH20] 5 cmH20 Plateau Pressure:  [20 cmH20-27 cmH20] 27 cmH20   Intake/Output Summary (Last 24 hours) at 07/24/2022 1505 Last data filed at 07/24/2022 1400 Gross per 24 hour  Intake 2247.93 ml    Output 525 ml  Net 1722.93 ml    Filed Weights   07/22/22 0807 07/22/22 1037 07/23/22 0500  Weight: 77.1 kg 90.2 kg 87.6 kg    Labs/imaging that I have personally reviewed  (right click and "Reselect all SmartList Selections" daily)   Examination: General: Acute on chronically ill-appearing female , intubated and sedated, no acute distress HENT: Atraumatic, normocephalic, neck supple, no JVD Lungs: Diminished with expiratory wheezes throughout, even, synchronous with the vent Cardiovascular:  Tachycardia, regular rhythm (sinus tach on telemetry), no M/R/G, 2+ radial/1+ distal pulses, no edema  Abdomen: +BS x4, soft, non distended, obese   Extremities: Generalized weakness, no edema, no cyanosis, warm extremities Neuro: Sedated not following commands, bilateral pupils pinpoint sluggish  GU: Foley catheter in place draining yellow urine  Synopsis  61 yo obese female with acute and severe hypoxic and hypercapnic respiratory failure due to SEPSIS POA due to seveer RSV pneumonia with RLL bacterial pneumonia  ASSESSMENT AND PLAN  Acute Hypoxic Respiratory Failure in the setting of RSV pneumonia & questionable superimposed CAP,along with severe COPD/Asthma exacerbation -Full vent support, implement lung protective strategies -Plateau pressures less than 30 cm H20 -Wean FiO2 & PEEP as tolerated to maintain O2 sats >92% -Follow intermittent Chest X-ray & ABG as needed -Spontaneous Breathing Trials when respiratory parameters met and mental status permits -Implement VAP Bundle -Bronchodilators & Pulmicort nebs -IV steroids (Solumedrol 40 mg BID) -ABX as above  RSV Pneumonia Questionable superimposed CAP -Monitor fever curve -Trend WBC's & Procalcitonin -Follow cultures as above -Continue empiric Azithromycin & Ceftriaxone pending cultures & sensitivities  Hypotension, suspect related to Sedation +/- Sepsis -Continuous cardiac monitoring -Maintain MAP >65 -IV fluids -Vasopressors as needed to maintain MAP goal -Lactic acid normal x2 -HS Troponin negative x2 -Consider Echocardiogram   Mild anemia without obvious signs of bleeding  - Trend CBC - Monitor for s/sx of bleeding and transfuse for hgb <7  Sedation needs in setting of mechanical ventilation -Maintain a RASS goal of 0 to -1 -Fentanyl and Propofol as needed to maintain RASS goal -Avoid sedating medications as able -Daily wake up assessment    Pt is critically ill, high risk for decompensation, cardiac arrest,  and death.  Given severe COPD, ancipitate pt likely be difficult to liberate from mechanical ventilation.   Best practice (right click and "Reselect all SmartList Selections" daily)  Diet: NPO, tube feeds DVT prophylaxis: LMWH GI prophylaxis: PPI Mobility:  bed rest  Code Status:  FULL Disposition: ICU  11/17: Updated pt's son Randall Hiss at bedside.  Labs   CBC: Recent Labs  Lab 07/22/22 0757 07/23/22 0710 07/24/22 0506  WBC 28.6* 11.8* 15.1*  NEUTROABS 18.6*  --   --   HGB 13.2 11.7* 12.1  HCT 43.5 39.3 40.7  MCV 70.4* 71.2* 71.7*  PLT 378 251 296     Basic Metabolic Panel: Recent Labs  Lab 07/22/22 0757 07/23/22 0710 07/24/22 0506  NA 141 143 142  K 4.2 4.6 4.4  CL 103 106 107  CO2 _0 GLUCOSE 131* 125* 147*  BUN 21 17 24*  CREATININE 0.76 0.46 0.61  CALCIUM 9.2 8.9 8.8*  MG  --  2.8* 2.3  PHOS  --  4.3 3.9    GFR: Estimated Creatinine Clearance: 71.7 mL/min (by C-G formula based on SCr of 0.61 mg/dL). Recent Labs  Lab 07/22/22 0757 07/22/22 0932 07/22/22 1119 07/23/22 0710 07/24/22 0506  WBC 28.6*  --   --  11.8* 15.1*  LATICACIDVEN  --  1.4 1.6  --   --      Liver Function Tests: Recent Labs  Lab 07/24/22 0506  AST 36  ALT 44  ALKPHOS 57  BILITOT 0.8  PROT 6.5  ALBUMIN 3.4*   No results for input(s): "LIPASE", "AMYLASE" in the last 168 hours. No results for input(s): "AMMONIA" in the last 168 hours.  ABG    Component Value Date/Time   PHART 7.37 07/23/2022 1736   PCO2ART 50 (H) 07/23/2022 1736   PO2ART 179 (H) 07/23/2022 1736   HCO3 28.9 (H) 07/23/2022 1736   O2SAT 98.8 07/22/2022 0756     Coagulation Profile: No results for input(s): "INR", "PROTIME" in the last 168 hours.  Cardiac Enzymes: No results for input(s): "CKTOTAL", "CKMB", "CKMBINDEX", "TROPONINI" in the last 168 hours.  HbA1C: No results found for: "HGBA1C"  CBG: Recent Labs  Lab 07/22/22 1024 07/24/22 0108 07/24/22 0514  GLUCAP 149* 122* 147*     Review of Symptoms:  Unable to assess, pt in intubated/sedated/critically ill  Past Medical History:  She,  has a past medical history of Asthma, Emphysema lung ([place]), Obesity, and Stage 3 severe COPD by GOLD classification ([place]).   Surgical History:  History reviewed. No pertinent surgical history.   Social History:   reports that she has been smoking cigarettes. She has never used smokeless tobacco. She reports that she does not drink alcohol and does not use drugs.   Family History:  Her family history is not on file.   Allergies No Known Allergies   Home Medications  Prior to Admission medications   Medication Sig Start Date End Date Taking? Authorizing Provider  azithromycin (ZITHROMAX) 250 MG tablet Take 250 mg by mouth daily. 08/27/20  Yes [provider]  celecoxib (CELEBREX) 200 MG capsule Take 200 mg by mouth daily. Patient not taking: Reported on 07/22/2022 06/26/20   [provider]  DIFFERIN 0.1 % cream Apply 1 application topically at bedtime. Patient not taking: Reported on 07/22/2022 06/26/20   [provider]  loratadine (CLARITIN) 10 MG tablet Take 10 mg by mouth daily. Patient not taking: Reported on 07/22/2022 06/26/20   [provider]  nicotine (NICODERM CQ - DOSED IN MG/24 HOURS) 21 mg/24hr patch Place 1 patch onto the skin daily. Patient not taking: Reported on 07/22/2022 07/24/20   [provider]  TRELEGY ELLIPTA 100-62.5-25 MCG/INH AEPB Take 1 puff by mouth daily. Patient not taking: Reported on 07/22/2022 08/06/20   [provider]       Critical Care Time: 40 minutes  Darel Hong, AGACNP-BC  Pulmonary & [place] epic messenger for cross cover needs If after hours, please call E-link

## 2022-07-24 NOTE — TOC Initial Note (Signed)
Transition of Care [facility]) - Initial/Assessment Note    Patient Details  Name: Aimee Hooper MRN: [id number] Date of Birth: [birth year]  [facility]) CM/SW Contact:    Allayne Butcher, RN Phone Number: 07/24/2022, 12:02 PM  Clinical Narrative:                 Patient admitted to the hospital with RSV pneumonia, patient required intubation in the ICU.  Patient remains on a ventilator sedated.  Patient brought in from home.  TOC will follow for needs.  Expected Discharge Plan: Home/Self Care Barriers to Discharge: Continued Medical Work up   Patient Goals and CMS Choice Patient states their goals for this hospitalization and ongoing recovery are:: unable to assess patient intubated      Expected Discharge Plan and Services Expected Discharge Plan: Home/Self Care       Living arrangements for the past 2 months: Apartment                                      Prior Living Arrangements/Services Living arrangements for the past 2 months: Apartment                     Activities of Daily Living   ADL Screening (condition at time of admission) Patient's cognitive ability adequate to safely complete daily activities?: Yes Is the patient deaf or have difficulty hearing?: No  Permission Sought/Granted                  Emotional Assessment   Attitude/Demeanor/Rapport: Intubated (Following Commands or Not Following Commands) Affect (typically observed): Unable to Assess   Alcohol / Substance Use: Not Applicable Psych Involvement: No (comment)  Admission diagnosis:  Respiratory failure (HCC) [J96.90] Pneumonia due to infectious organism, unspecified laterality, unspecified part of lung [J18.9] Patient Active Problem List   Diagnosis Date Noted   Pneumonia due to infectious organism 07/23/2022   Respiratory failure (HCC) 07/22/2022   Pneumonia due to COVID-19 virus 09/01/2020   Acute respiratory failure due to COVID-19 [facility]) 09/01/2020    Stage 3 severe COPD by GOLD classification (HCC) 11/25/2016   PCP:  Pcp, No Pharmacy:  No Pharmacies Listed    Social Determinants of Health (SDOH) Interventions    Readmission Risk Interventions     No data to display

## 2022-07-24 NOTE — Progress Notes (Addendum)
Initial Nutrition Assessment  DOCUMENTATION CODES:   Obesity unspecified  INTERVENTION:   Vital HP @50ml /hr- Initiate at 14ml/hr and increase by 66ml/hr q 8 hours until goal rate is reached.   Free water flushes 36ml q4 hours to maintain tube patency   Regimen provides 1200kcal/day, 105g/day protein and 1151ml/day of free water.   Liquid MVI daily via tube   Daily weights   Pt at high refeed risk; recommend monitor potassium, magnesium and phosphorus labs daily until stable  NUTRITION DIAGNOSIS:   Inadequate oral intake related to inability to eat (pt sedated and ventilated) as evidenced by NPO status.  GOAL:   Provide needs based on ASPEN/SCCM guidelines  MONITOR:   Vent status, Labs, Weight trends, Skin, I & O's, TF tolerance  REASON FOR ASSESSMENT:   Ventilator    ASSESSMENT:   61 y/o female with h/o COPD who is admitted with COPD exacerbation, RSV and CAP.  Pt sedated and ventilated. OGT in place. Plan is to start tube feeds today. Per chart, pt appears weight stable at baseline. Pt is currently up ~8lbs from her UBW. Pt +1.4L on her I & Os.  Medications reviewed and include: colace, lovenox, solu-medrol, oxycodone, protonix, azithromycin, ceftriaxone, LRS @100ml /hr, levophed, propofol   Labs reviewed: K 4.4 wnl, BUN 24(H), P 3.9 wnl, Mg 2.3 wnl Wbc- 15.1(H) Cbgs- 147, 122 x 24 hrs  Patient is currently intubated on ventilator support MV: 7.2 L/min Temp (24hrs), Avg:98.7 F (37.1 C), Min:98 F (36.7 C), Max:99.4 F (37.4 C)  Propofol: 15.77 ml/hr- provides 416kcal/day   MAP- >19mmHg   UOP-   NUTRITION - FOCUSED PHYSICAL EXAM:  Flowsheet Row Most Recent Value  Orbital Region No depletion  Upper Arm Region No depletion  Thoracic and Lumbar Region No depletion  Buccal Region No depletion  Temple Region No depletion  Clavicle Bone Region No depletion  Clavicle and Acromion Bone Region No depletion  Scapular Bone Region No depletion   Dorsal Hand No depletion  Patellar Region No depletion  Anterior Thigh Region No depletion  Posterior Calf Region No depletion  Edema (RD Assessment) None  Hair Reviewed  Eyes Reviewed  Mouth Reviewed  Skin Reviewed  Nails Reviewed   Diet Order:   Diet Order             Diet NPO time specified  Diet effective now                  EDUCATION NEEDS:   No education needs have been identified at this time  Skin:  Skin Assessment: Reviewed RN Assessment  Last BM:  PTA  Height:   Ht Readings from Last 1 Encounters:  07/22/22 5' (1.524 m)    Weight:   Wt Readings from Last 1 Encounters:  07/23/22 87.6 kg    Ideal Body Weight:  45.4 kg  BMI:  Body mass index is 37.72 kg/m.  Estimated Nutritional Needs:   Kcal:  963-1226kcal/day  Protein:  >90g/day  Fluid:  1.4-1.6L/day  07/24/22 MS, RD, LDN Please refer to [facility] for RD and/or RD on-call/weekend/after hours pager

## 2022-07-25 ENCOUNTER — Inpatient Hospital Stay: Payer: Medicaid Other

## 2022-07-25 DIAGNOSIS — J189 Pneumonia, unspecified organism: Secondary | ICD-10-CM | POA: Diagnosis not present

## 2022-07-25 DIAGNOSIS — J9621 Acute and chronic respiratory failure with hypoxia: Secondary | ICD-10-CM | POA: Diagnosis not present

## 2022-07-25 DIAGNOSIS — J9622 Acute and chronic respiratory failure with hypercapnia: Secondary | ICD-10-CM | POA: Diagnosis not present

## 2022-07-25 LAB — GLUCOSE, CAPILLARY
Glucose-Capillary: 108 mg/dL — ABNORMAL HIGH (ref 70–99)
Glucose-Capillary: 134 mg/dL — ABNORMAL HIGH (ref 70–99)
Glucose-Capillary: 136 mg/dL — ABNORMAL HIGH (ref 70–99)
Glucose-Capillary: 141 mg/dL — ABNORMAL HIGH (ref 70–99)
Glucose-Capillary: 143 mg/dL — ABNORMAL HIGH (ref 70–99)
Glucose-Capillary: 143 mg/dL — ABNORMAL HIGH (ref 70–99)
Glucose-Capillary: 148 mg/dL — ABNORMAL HIGH (ref 70–99)

## 2022-07-25 LAB — CBC
HCT: 36.4 % (ref 36.0–46.0)
Hemoglobin: 10.8 g/dL — ABNORMAL LOW (ref 12.0–15.0)
MCH: 21.6 pg — ABNORMAL LOW (ref 26.0–34.0)
MCHC: 29.7 g/dL — ABNORMAL LOW (ref 30.0–36.0)
MCV: 72.8 fL — ABNORMAL LOW (ref 80.0–100.0)
Platelets: 262 10*3/uL (ref 150–400)
RBC: 5 MIL/uL (ref 3.87–5.11)
RDW: 16.8 % — ABNORMAL HIGH (ref 11.5–15.5)
WBC: 14.4 10*3/uL — ABNORMAL HIGH (ref 4.0–10.5)
nRBC: 0.1 % (ref 0.0–0.2)

## 2022-07-25 LAB — MAGNESIUM: Magnesium: 2.3 mg/dL (ref 1.7–2.4)

## 2022-07-25 LAB — BASIC METABOLIC PANEL WITH GFR
Anion gap: 7 (ref 5–15)
BUN: 19 mg/dL (ref 8–23)
CO2: 30 mmol/L (ref 22–32)
Calcium: 8.8 mg/dL — ABNORMAL LOW (ref 8.9–10.3)
Chloride: 104 mmol/L (ref 98–111)
Creatinine, Ser: 0.4 mg/dL — ABNORMAL LOW (ref 0.44–1.00)
GFR, Estimated: >60 mL/min
Glucose, Bld: 165 mg/dL — ABNORMAL HIGH (ref 70–99)
Potassium: 4.5 mmol/L (ref 3.5–5.1)
Sodium: 141 mmol/L (ref 135–145)

## 2022-07-25 LAB — PHOSPHORUS: Phosphorus: 3 mg/dL (ref 2.5–4.6)

## 2022-07-25 MED ORDER — ORAL CARE MOUTH RINSE
15.0000 mL | OROMUCOSAL | Status: DC
  Administered 2022-07-25 – 2022-07-30 (×54): 15 mL via OROMUCOSAL

## 2022-07-25 MED ORDER — ALBUTEROL (5 MG/ML) CONTINUOUS INHALATION SOLN
5.0000 mg/h | INHALATION_SOLUTION | RESPIRATORY_TRACT | Status: DC
  Administered 2022-07-25: 5 mg/h via RESPIRATORY_TRACT
  Filled 2022-07-25: qty 20

## 2022-07-25 MED ORDER — METHYLPREDNISOLONE SODIUM SUCC 40 MG IJ SOLR
40.0000 mg | Freq: Once | INTRAMUSCULAR | Status: AC
  Administered 2022-07-25: 40 mg via INTRAVENOUS
  Filled 2022-07-25: qty 1

## 2022-07-25 MED ORDER — KETAMINE HCL 10 MG/ML IJ SOLN
1.0000 mg/kg/h | Status: DC
  Administered 2022-07-25 – 2022-07-27 (×5): 1 mg/kg/h via INTRAVENOUS
  Filled 2022-07-25 (×5): qty 100
  Filled 2022-07-25: qty 80
  Filled 2022-07-25 (×4): qty 100

## 2022-07-25 MED ORDER — KETAMINE HCL-SODIUM CHLORIDE 1000-0.9 MG/100ML-% IV SOLN
1.0000 mg/kg/h | INTRAVENOUS | Status: DC
  Filled 2022-07-25: qty 100

## 2022-07-25 MED ORDER — ALBUTEROL SULFATE (2.5 MG/3ML) 0.083% IN NEBU
INHALATION_SOLUTION | RESPIRATORY_TRACT | Status: AC
  Filled 2022-07-25: qty 6

## 2022-07-25 NOTE — Progress Notes (Signed)
PHARMACY CONSULT NOTE  Pharmacy Consult for Electrolyte Monitoring and Replacement   Recent Labs: Potassium (mmol/L)  Date Value  07/25/2022 4.5   Magnesium (mg/dL)  Date Value  33/83/2919 2.3   Calcium (mg/dL)  Date Value  16/60/6004 8.8 (L)   Albumin (g/dL)  Date Value  59/97/7414 3.4 (L)   Phosphorus (mg/dL)  Date Value  23/95/3202 3.0   Sodium (mmol/L)  Date Value  07/25/2022 141   Assessment: 61 y/o female with h/o COPD who is admitted with COPD exacerbation, RSV and CAP.   Pt sedated and ventilated. OGT in place. Plan is to start tube feeds today.   Goal of Therapy:  Electrolytes within normal limits  Plan:  --No electrolyte replacement warranted for today --Follow-up electrolytes with AM labs tomorrow  Tressie Ellis 07/25/2022 7:58 AM

## 2022-07-25 NOTE — Progress Notes (Signed)
NAME:  Aimee Hooper, MRN:  [id number], DOB:  09/23/1960, LOS: 3 ADMISSION DATE:  07/22/2022  CHIEF COMPLAINT:  severe resp distress  BRIEF SYNOPSIS 61 yo obese female with RSV pneumonia, severe COPD exacerbation, RLL pneumonia SEPSIS POA  History of Present Illness:  61 y.o. female history of COPD and asthma not on home O2 per patient report who was diagnosed with RSV yesterday who presents to the emergency department in severe  respiratory distress.  extensive Wheezing, shortness of breath starting this morning.   Severe increased WOB and using accessory muscles to breathe   ER Course EMS personnel who report giving 125 IV Solu-Medrol, IV magnesium, 3 DuoNebs while in route to the emergency department. WBC 28 HBG 13 TROP 11  Placed on BiPAP.  PCCM asked to admit for further workup and treatment.   Please see "significant hospital events" section below for full detailed hospital course.    Pertinent  Medical History  Stage 3 Severe COPD Asthma  Obesity  Emphysema of Lung   Significant Hospital Events: Including procedures, antibiotic start and stop dates in addition to other pertinent events   11/15: Admitted to ICU for severe COPD exacerbation, on BiPAP 11/16: Pt remains on Bipap _0 % requiring precedex gtt and prn ativan due to intermittent anxiety/agitation.  Ultimately required INTUBATION & MECHANICAL VENTILATION 11/17:  Requiring low dose peripheral Levophed due to sedation.  Lung sounds remain diminished with slight wheeze, will rest on vent today with no SBT. 11/18: Slight improvement in air entry bilaterally.  Still with evidence of bronchoconstriction as Peak pressures in mid 30's, exercised in PSV for about 30 minutes, then having episodes of apnea, so placed back on full support. Leukocytosis slowly improving.  Tracheal aspirate still pending  Micro Data:  Resp Panel by RT-PCR (Flu A&B, Covid) 11/15>>negative  MRSA PCR 11/15>>negative  Blood x2 11/15>> no  growth to date Tracheal aspirate 11/16>>  Antimicrobials:   Antibiotics Given (last 72 hours)     Date/Time Action Medication Dose Rate   07/23/22 1006 New Bag/Given   cefTRIAXone (ROCEPHIN) 2 g in sodium chloride 0.9 % 100 mL IVPB 2 g 200 mL/hr   07/23/22 1130 New Bag/Given   azithromycin (ZITHROMAX) 500 mg in sodium chloride 0.9 % 250 mL IVPB 500 mg 250 mL/hr   07/24/22 1056 New Bag/Given   cefTRIAXone (ROCEPHIN) 2 g in sodium chloride 0.9 % 100 mL IVPB 2 g 200 mL/hr   07/24/22 1131 New Bag/Given   azithromycin (ZITHROMAX) 500 mg in sodium chloride 0.9 % 250 mL IVPB 500 mg 250 mL/hr   07/25/22 0924 New Bag/Given   cefTRIAXone (ROCEPHIN) 2 g in sodium chloride 0.9 % 100 mL IVPB 2 g 200 mL/hr      Subjective:   -No significant events noted overnight -Afebrile, hemodynamically stable -Vent: 40% FiO2 & 5 PEEP -Slight improvement in air entry bilaterally today,  Still with evidence of bronchoconstriction as Peak pressures in mid 30's, exercised in PSV for about 30 minutes, then having episodes of apnea ~ placed back on full vent support -Leukocytosis slowly improving.  Tracheal aspirate still pending -Requiring 4 mcg Levophed when sedated (able to weaned off while awake)   Objective   Blood pressure (!) 102/37, pulse (!) 104, temperature 98.8 F (37.1 C), temperature source Oral, resp. rate 14, height 5' (1.524 m), weight 92.4 kg, SpO2 97 %.    Vent Mode: PRVC FiO2 (%):  [40 %] 40 % Set Rate:  [16 bmp] 16 bmp Vt  Set:  [161 mL-400 mL] 400 mL PEEP:  [5 [place] Pressure:  [22 cmH20] 22 cmH20   Intake/Output Summary (Last 24 hours) at 07/25/2022 0942 Last data filed at 07/25/2022 0600 Gross per 24 hour  Intake 2752.58 ml  Output 760 ml  Net 1992.58 ml    Filed Weights   07/22/22 1037 07/23/22 0500 07/25/22 0443  Weight: 90.2 kg 87.6 kg 92.4 kg    Labs/imaging that I have personally reviewed  (right click and "Reselect all SmartList Selections" daily)    Examination: General: Acute on chronically ill-appearing female , intubated and sedated, no acute distress HENT: Atraumatic, normocephalic, neck supple, no JVD Lungs: Diminished with expiratory wheezes throughout, even, occasionally overbreathes the vent Cardiovascular: Tachycardia, regular rhythm (sinus tach on telemetry), no M/R/G, 2+ radial/1+ distal pulses, no edema  Abdomen: +BS x4, soft, non distended, obese   Extremities: Generalized weakness, no edema, no cyanosis, warm extremities Neuro: Lightly sedated (RASS -1), MAE to commands, nods to questions, no focal deficits, pupils PERRL  GU: Foley catheter in place draining yellow urine  Synopsis  61 yo obese female with acute and severe hypoxic and hypercapnic respiratory failure due to SEPSIS POA due to seveer RSV pneumonia with RLL bacterial pneumonia  ASSESSMENT AND PLAN  Acute Hypoxic Respiratory Failure in the setting of RSV pneumonia & questionable superimposed CAP,along with severe COPD/Asthma exacerbation -Full vent support, implement lung protective strategies -Plateau pressures less than 30 cm H20 -Wean FiO2 & PEEP as tolerated to maintain O2 sats >92% -Follow intermittent Chest X-ray & ABG as needed -Spontaneous Breathing Trials when respiratory parameters met and mental status permits -Implement VAP Bundle -Bronchodilators & Pulmicort nebs -IV steroids (Solumedrol 40 mg BID) -ABX as above  RSV Pneumonia Questionable superimposed CAP -Monitor fever curve -Trend WBC's & Procalcitonin -Follow cultures as above -Continue empiric Azithromycin & Ceftriaxone pending cultures & sensitivities  Hypotension, suspect related to Sedation +/- Sepsis -Continuous cardiac monitoring -Maintain MAP >65 -Vasopressors as needed to maintain MAP goal -Lactic acid normal x2 -HS Troponin negative x2 -Consider Echocardiogram   Mild anemia without obvious signs of bleeding  - Trend CBC - Monitor for s/sx of bleeding and transfuse  for hgb <7  Sedation needs in setting of mechanical ventilation -Maintain a RASS goal of 0 to -1 -Fentanyl and Propofol as needed to maintain RASS goal -Avoid sedating medications as able -Daily wake up assessment    Pt is critically ill, high risk for decompensation, cardiac arrest, and death.  Given severe COPD, ancipitate pt likely be difficult to liberate from mechanical ventilation.   Best practice (right click and "Reselect all SmartList Selections" daily)  Diet: NPO, tube feeds DVT prophylaxis: LMWH GI prophylaxis: PPI Mobility:  bed rest  Code Status:  FULL Disposition: ICU  11/18: Updated pt's son Randall Hiss at bedside.  Labs   CBC: Recent Labs  Lab 07/22/22 0757 07/23/22 0710 07/24/22 0506 07/25/22 0431  WBC 28.6* 11.8* 15.1* 14.4*  NEUTROABS 18.6*  --   --   --   HGB 13.2 11.7* 12.1 10.8*  HCT 43.5 39.3 40.7 36.4  MCV 70.4* 71.2* 71.7* 72.8*  PLT 378 251 296 262     Basic Metabolic Panel: Recent Labs  Lab 07/22/22 0757 07/23/22 0710 07/24/22 0506 07/25/22 0431  NA 141 143 142 141  K 4.2 4.6 4.4 4.5  CL 103 106 107 104  CO2 _0 GLUCOSE 131* 125* 147* 165*  BUN 21 17 24* 19  CREATININE 0.76 0.46 0.61 0.40*  CALCIUM 9.2 8.9 8.8* 8.8*  MG  --  2.8* 2.3 2.3  PHOS  --  4.3 3.9 3.0    GFR: Estimated Creatinine Clearance: 74 mL/min (A) (by C-G formula based on SCr of 0.4 mg/dL (L)). Recent Labs  Lab 07/22/22 0757 07/22/22 0932 07/22/22 1119 07/23/22 0710 07/24/22 0506 07/25/22 0431  WBC 28.6*  --   --  11.8* 15.1* 14.4*  LATICACIDVEN  --  1.4 1.6  --   --   --      Liver Function Tests: Recent Labs  Lab 07/24/22 0506  AST 36  ALT 44  ALKPHOS 57  BILITOT 0.8  PROT 6.5  ALBUMIN 3.4*    No results for input(s): "LIPASE", "AMYLASE" in the last 168 hours. No results for input(s): "AMMONIA" in the last 168 hours.  ABG    Component Value Date/Time   PHART 7.37 07/23/2022 1736   PCO2ART 50 (H) 07/23/2022 1736   PO2ART 179 (H)  07/23/2022 1736   HCO3 28.9 (H) 07/23/2022 1736   O2SAT 98.8 07/22/2022 0756     Coagulation Profile: No results for input(s): "INR", "PROTIME" in the last 168 hours.  Cardiac Enzymes: No results for input(s): "CKTOTAL", "CKMB", "CKMBINDEX", "TROPONINI" in the last 168 hours.  HbA1C: No results found for: "HGBA1C"  CBG: Recent Labs  Lab 07/24/22 1556 07/24/22 1942 07/25/22 0015 07/25/22 0357 07/25/22 0806  GLUCAP 127* 144* 141* 143* 148*    Review of Symptoms:  Unable to assess, pt in intubated/sedated/critically ill  Past Medical History:  She,  has a past medical history of Asthma, Emphysema lung ([place]), Obesity, and Stage 3 severe COPD by GOLD classification ([place]).   Surgical History:  History reviewed. No pertinent surgical history.   Social History:   reports that she has been smoking cigarettes. She has never used smokeless tobacco. She reports that she does not drink alcohol and does not use drugs.   Family History:  Her family history is not on file.   Allergies No Known Allergies   Home Medications  Prior to Admission medications   Medication Sig Start Date End Date Taking? Authorizing Provider  azithromycin (ZITHROMAX) 250 MG tablet Take 250 mg by mouth daily. 08/27/20  Yes [provider]  celecoxib (CELEBREX) 200 MG capsule Take 200 mg by mouth daily. Patient not taking: Reported on 07/22/2022 06/26/20   [provider]  DIFFERIN 0.1 % cream Apply 1 application topically at bedtime. Patient not taking: Reported on 07/22/2022 06/26/20   [provider]  loratadine (CLARITIN) 10 MG tablet Take 10 mg by mouth daily. Patient not taking: Reported on 07/22/2022 06/26/20   [provider]  nicotine (NICODERM CQ - DOSED IN MG/24 HOURS) 21 mg/24hr patch Place 1 patch onto the skin daily. Patient not taking: Reported on 07/22/2022 07/24/20   [provider]  TRELEGY ELLIPTA 100-62.5-25 MCG/INH AEPB Take 1 puff by  mouth daily. Patient not taking: Reported on 07/22/2022 08/06/20   [provider]       Critical Care Time: 40 minutes  Darel Hong, AGACNP-BC [facility] epic messenger for cross cover needs If after hours, please call E-link

## 2022-07-26 ENCOUNTER — Inpatient Hospital Stay: Payer: Self-pay

## 2022-07-26 ENCOUNTER — Inpatient Hospital Stay: Payer: Medicaid Other

## 2022-07-26 DIAGNOSIS — J189 Pneumonia, unspecified organism: Secondary | ICD-10-CM | POA: Diagnosis not present

## 2022-07-26 DIAGNOSIS — J9622 Acute and chronic respiratory failure with hypercapnia: Secondary | ICD-10-CM | POA: Diagnosis not present

## 2022-07-26 DIAGNOSIS — J9621 Acute and chronic respiratory failure with hypoxia: Secondary | ICD-10-CM | POA: Diagnosis not present

## 2022-07-26 LAB — BASIC METABOLIC PANEL
Anion gap: 6 (ref 5–15)
BUN: 18 mg/dL (ref 8–23)
BUN: 21 mg/dL (ref 8–23)
CO2: 35 mmol/L — ABNORMAL HIGH (ref 22–32)
CO2: >45 mmol/L — ABNORMAL HIGH (ref 22–32)
Calcium: 8.5 mg/dL — ABNORMAL LOW (ref 8.9–10.3)
Calcium: 8.7 mg/dL — ABNORMAL LOW (ref 8.9–10.3)
Chloride: 102 mmol/L (ref 98–111)
Chloride: 89 mmol/L — ABNORMAL LOW (ref 98–111)
Creatinine, Ser: 0.35 mg/dL — ABNORMAL LOW (ref 0.44–1.00)
Creatinine, Ser: 0.49 mg/dL (ref 0.44–1.00)
GFR, Estimated: >60 mL/min (ref >60)
GFR, Estimated: >60 mL/min (ref >60)
Glucose, Bld: 127 mg/dL — ABNORMAL HIGH (ref 70–99)
Glucose, Bld: 106 mg/dL — ABNORMAL HIGH (ref 70–99)
Potassium: 4.4 mmol/L (ref 3.5–5.1)
Potassium: 3.5 mmol/L (ref 3.5–5.1)
Sodium: 143 mmol/L (ref 135–145)
Sodium: 143 mmol/L (ref 135–145)

## 2022-07-26 LAB — CBC
HCT: 33.9 % — ABNORMAL LOW (ref 36.0–46.0)
Hemoglobin: 10 g/dL — ABNORMAL LOW (ref 12.0–15.0)
MCH: 21.5 pg — ABNORMAL LOW (ref 26.0–34.0)
MCHC: 29.5 g/dL — ABNORMAL LOW (ref 30.0–36.0)
MCV: 72.7 fL — ABNORMAL LOW (ref 80.0–100.0)
Platelets: 208 10*3/uL (ref 150–400)
RBC: 4.66 MIL/uL (ref 3.87–5.11)
RDW: 16.7 % — ABNORMAL HIGH (ref 11.5–15.5)
WBC: 12.7 10*3/uL — ABNORMAL HIGH (ref 4.0–10.5)
nRBC: 0.2 % (ref 0.0–0.2)

## 2022-07-26 LAB — GLUCOSE, CAPILLARY
Glucose-Capillary: 110 mg/dL — ABNORMAL HIGH (ref 70–99)
Glucose-Capillary: 108 mg/dL — ABNORMAL HIGH (ref 70–99)
Glucose-Capillary: 150 mg/dL — ABNORMAL HIGH (ref 70–99)
Glucose-Capillary: 119 mg/dL — ABNORMAL HIGH (ref 70–99)
Glucose-Capillary: 106 mg/dL — ABNORMAL HIGH (ref 70–99)
Glucose-Capillary: 128 mg/dL — ABNORMAL HIGH (ref 70–99)

## 2022-07-26 LAB — CULTURE, RESPIRATORY W GRAM STAIN

## 2022-07-26 LAB — BRAIN NATRIURETIC PEPTIDE: B Natriuretic Peptide: 38.8 pg/mL (ref 0.0–100.0)

## 2022-07-26 LAB — PHOSPHORUS: Phosphorus: 3.5 mg/dL (ref 2.5–4.6)

## 2022-07-26 LAB — MAGNESIUM: Magnesium: 2.3 mg/dL (ref 1.7–2.4)

## 2022-07-26 MED ORDER — FENTANYL CITRATE PF 50 MCG/ML IJ SOSY
25.0000 ug | PREFILLED_SYRINGE | INTRAMUSCULAR | Status: DC | PRN
  Administered 2022-07-26 – 2022-07-29 (×15): 50 ug via INTRAVENOUS
  Administered 2022-07-29: 25 ug via INTRAVENOUS
  Administered 2022-07-29 – 2022-07-30 (×5): 50 ug via INTRAVENOUS
  Filled 2022-07-26 (×22): qty 1

## 2022-07-26 MED ORDER — ALBUTEROL SULFATE (2.5 MG/3ML) 0.083% IN NEBU
INHALATION_SOLUTION | RESPIRATORY_TRACT | Status: AC
  Filled 2022-07-26: qty 9

## 2022-07-26 MED ORDER — MAGNESIUM SULFATE 2 GM/50ML IV SOLN
2.0000 g | Freq: Once | INTRAVENOUS | Status: AC
  Administered 2022-07-26: 2 g via INTRAVENOUS
  Filled 2022-07-26: qty 50

## 2022-07-26 MED ORDER — SODIUM CHLORIDE 0.9% FLUSH
10.0000 mL | Freq: Two times a day (BID) | INTRAVENOUS | Status: DC
  Administered 2022-07-26 – 2022-08-04 (×18): 10 mL

## 2022-07-26 MED ORDER — FUROSEMIDE 10 MG/ML IJ SOLN
40.0000 mg | Freq: Two times a day (BID) | INTRAMUSCULAR | Status: DC
  Administered 2022-07-26: 40 mg via INTRAVENOUS
  Filled 2022-07-26 (×2): qty 4

## 2022-07-26 MED ORDER — ALBUTEROL SULFATE (2.5 MG/3ML) 0.083% IN NEBU
7.5000 mg | INHALATION_SOLUTION | RESPIRATORY_TRACT | Status: AC
  Administered 2022-07-26: 7.5 mg via RESPIRATORY_TRACT

## 2022-07-26 MED ORDER — FUROSEMIDE 10 MG/ML IJ SOLN
40.0000 mg | Freq: Once | INTRAMUSCULAR | Status: AC
  Administered 2022-07-26: 40 mg via INTRAVENOUS

## 2022-07-26 MED ORDER — ROCURONIUM BROMIDE 10 MG/ML (PF) SYRINGE
PREFILLED_SYRINGE | INTRAVENOUS | Status: AC
  Administered 2022-07-26: 50 mg via INTRAVENOUS
  Filled 2022-07-26: qty 10

## 2022-07-26 MED ORDER — FUROSEMIDE 10 MG/ML IJ SOLN: 40.0000 mg | Freq: Every day | INTRAMUSCULAR | Status: DC

## 2022-07-26 MED ORDER — ENOXAPARIN SODIUM 60 MG/0.6ML IJ SOSY
0.5000 mg/kg | PREFILLED_SYRINGE | INTRAMUSCULAR | Status: DC
  Administered 2022-07-27 – 2022-07-30 (×4): 47.5 mg via SUBCUTANEOUS
  Filled 2022-07-26 (×4): qty 0.6

## 2022-07-26 MED ORDER — LEVOFLOXACIN IN D5W 750 MG/150ML IV SOLN
750.0000 mg | INTRAVENOUS | Status: DC
  Administered 2022-07-26: 750 mg via INTRAVENOUS
  Filled 2022-07-26 (×2): qty 150

## 2022-07-26 MED ORDER — ROCURONIUM BROMIDE 10 MG/ML (PF) SYRINGE: 50.0000 mg | PREFILLED_SYRINGE | Freq: Once | INTRAVENOUS | Status: AC

## 2022-07-26 MED ORDER — SODIUM CHLORIDE 0.9% FLUSH: 10.0000 mL | INTRAVENOUS | Status: DC | PRN

## 2022-07-26 NOTE — Progress Notes (Signed)
PHARMACY CONSULT NOTE  Pharmacy Consult for Electrolyte Monitoring and Replacement   Recent Labs: Potassium (mmol/L)  Date Value  07/26/2022 4.4   Magnesium (mg/dL)  Date Value  67/08/4579 2.3   Calcium (mg/dL)  Date Value  99/83/3825 8.5 (L)   Albumin (g/dL)  Date Value  05/39/7673 3.4 (L)   Phosphorus (mg/dL)  Date Value  41/93/7902 3.5   Sodium (mmol/L)  Date Value  07/26/2022 143   Assessment: 61 y/o female with h/o COPD who is admitted with COPD exacerbation, RSV and CAP.   Pt sedated and ventilated. OGT in place. On tube feeds.  Goal of Therapy:  Electrolytes within normal limits  Plan:  --No electrolyte replacement warranted for today --Follow-up electrolytes with AM labs tomorrow  Tressie Ellis 07/26/2022 2:09 PM

## 2022-07-26 NOTE — Progress Notes (Signed)
Pulmonary and Critical care    NAME:  Aimee Hooper, MRN:  [id number], DOB:  1961-04-06, LOS: 4 ADMISSION DATE:  07/22/2022,     History of Present Illness:  Patient had elevation in peak pressure to 50.  She was switched to pressure control however this did not help and was switched back to PRVC and tidal volume was reduced.  Chest x-ray was repeated. The patient has been tolerating tube feeds family at bedside update was given. The patient is unable to give any meaningful history.   Chest x-ray images personally seen today by myself show hyperinflated lung fields.         07/26/2022    6:00 AM 07/26/2022    5:00 AM 07/26/2022    4:59 AM  Vitals with BMI  Weight   212 lbs 8 oz  BMI   41.51  Systolic 126 124   Diastolic 70 69   Pulse 83 86     Vent Mode: PRVC FiO2 (%):  [40 %] 40 % Set Rate:  [16 bmp] 16 bmp Vt Set:  [380 mL-400 mL] 380 mL PEEP:  [5 cmH20-10 cmH20] 5 cmH20 Plateau Pressure:  [22 cmH20] 22 cmH20   Examination: General: Currently sedated did not open eyes to sternal rub. HENT: No icterus.  No clear JVD seen.  Neck obesity limits examination Lungs: Air entry is very diminished bilaterally.  Resonant to percussion anteriorly Cardiovascular: S1 S2 no clear murmurs or gallops Abdomen: Nontender bowel sounds are heard. Extremities: Edema present pulses palpable Neuro: Did not move extremities spontaneously or to commands pupils are reactive      Assessment & Plan:  Acute on chronic hypoxic and hypercapnic respiratory failure RSV respiratory infection. COPD and asthma exacerbation at baseline severe COPD Tracheobronchitis with Klebsiella festivities pending Suspect acute on chronic diastolic heart failure  Plan 1.  Permissive hypercapnia 2.  Start diuresis 3.  Rare Klebsiella reported on respiratory culture from November 16.  Sensitivities not back.  Will be switching to levofloxacin.  Continue ceftriaxone and azithromycin. 4.  Single dose of  rocuronium was given which led to improvement in peak pressure. 5.  Patient was tried on pressure control ventilation however this was unsuccessful with very low tidal volume.  And oxygen desaturation subsequently switched back to [facility] and tidal volume was reduced to 380. 6.  The patient is on ketamine and propofol. 7.  Multiple albuterol neb treatments given as well as magnesium given. 8.  Echocardiogram  Best Practice (right click and "Reselect all SmartList Selections" daily)   Diet/type: tubefeeds DVT prophylaxis: LMWH GI prophylaxis: PPI Foley:  Yes, and it is still needed Code Status:  full code   Labs   CBC: Recent Labs  Lab 07/22/22 0757 07/23/22 0710 07/24/22 0506 07/25/22 0431 07/26/22 0428  WBC 28.6* 11.8* 15.1* 14.4* 12.7*  NEUTROABS 18.6*  --   --   --   --   HGB 13.2 11.7* 12.1 10.8* 10.0*  HCT 43.5 39.3 40.7 36.4 33.9*  MCV 70.4* 71.2* 71.7* 72.8* 72.7*  PLT 378 251 296 262 208    Basic Metabolic Panel: Recent Labs  Lab 07/22/22 0757 07/23/22 0710 07/24/22 0506 07/25/22 0431 07/26/22 0428  NA 141 143 142 141 143  K 4.2 4.6 4.4 4.5 4.4  CL 103 106 107 104 102  CO2 29 30 30 30  35*  GLUCOSE 131* 125* 147* 165* 127*  BUN 21 17 24* 19 18  CREATININE 0.76 0.46 0.61 0.40* 0.35*  CALCIUM 9.2  8.9 8.8* 8.8* 8.5*  MG  --  2.8* 2.3 2.3 2.3  PHOS  --  4.3 3.9 3.0 3.5   GFR: Estimated Creatinine Clearance: 75.9 mL/min (A) (by C-G formula based on SCr of 0.35 mg/dL (L)). Recent Labs  Lab 07/22/22 0932 07/22/22 1119 07/23/22 0710 07/24/22 0506 07/25/22 0431 07/26/22 0428  WBC  --   --  11.8* 15.1* 14.4* 12.7*  LATICACIDVEN 1.4 1.6  --   --   --   --     Liver Function Tests: Recent Labs  Lab 07/24/22 0506  AST 36  ALT 44  ALKPHOS 57  BILITOT 0.8  PROT 6.5  ALBUMIN 3.4*   No results for input(s): "LIPASE", "AMYLASE" in the last 168 hours. No results for input(s): "AMMONIA" in the last 168 hours.  ABG    Component Value Date/Time   PHART  7.38 07/26/2022 1046   PCO2ART 82 (HH) 07/26/2022 1046   PO2ART 70 (L) 07/26/2022 1046   HCO3 48.5 (H) 07/26/2022 1046   O2SAT 95.6 07/26/2022 1046     Coagulation Profile: No results for input(s): "INR", "PROTIME" in the last 168 hours.  Cardiac Enzymes: No results for input(s): "CKTOTAL", "CKMB", "CKMBINDEX", "TROPONINI" in the last 168 hours.  HbA1C: No results found for: "HGBA1C"  CBG: Recent Labs  Lab 07/25/22 1626 07/25/22 1932 07/25/22 2326 07/26/22 0414 07/26/22 0802  GLUCAP 143* 134* 108* 110* 108*    RADIOLOGY   Review of Systems:   Unable to give review of systems  Past Medical History:  She,  has a past medical history of Asthma, Emphysema lung ([place]), Obesity, and Stage 3 severe COPD by GOLD classification ([place]).   Surgical History:  History reviewed. No pertinent surgical history.   Social History:   reports that she has been smoking cigarettes. She has never used smokeless tobacco. She reports that she does not drink alcohol and does not use drugs.   Family History:  Her family history is not on file.   Allergies No Known Allergies   Home Medications  Prior to Admission medications   Medication Sig Start Date End Date Taking? Authorizing Provider  albuterol (VENTOLIN HFA) 108 (90 Base) MCG/ACT inhaler Inhale 2 puffs into the lungs every 6 (six) hours as needed for wheezing. 03/03/22 03/03/23 Yes [provider]  azithromycin (ZITHROMAX) 250 MG tablet Take 250 mg by mouth daily. 08/27/20  Yes [provider]  BREZTRI AEROSPHERE 160-9-4.8 MCG/ACT AERO Inhale 2 puffs into the lungs 2 (two) times daily. 09/25/21  Yes [provider]  budesonide-formoterol (SYMBICORT) 80-4.5 MCG/ACT inhaler Inhale 2 puffs into the lungs 2 (two) times daily. 12/17/21 12/17/22 Yes [provider]  ipratropium (ATROVENT) 0.02 % nebulizer solution Take 0.5 mg by nebulization every 6 (six) hours as needed for wheezing.   Yes [provider]  montelukast (SINGULAIR) 10 MG tablet Take 10 mg by mouth daily. 05/05/22 05/05/23 Yes [provider]     Critical care time:  I have personally spent ______60____ minutes of critical care time, exclusive of time spent on any  procedures, in evaluation and management of this critically ill patient's condition.    Noha Karasik MD ([place]) FOR [facility]

## 2022-07-26 NOTE — Progress Notes (Signed)
Peripherally Inserted Central Catheter Placement  The IV Nurse has discussed with the patient and/or persons authorized to consent for the patient, the purpose of this procedure and the potential benefits and risks involved with this procedure.  The benefits include less needle sticks, lab draws from the catheter, and the patient may be discharged home with the catheter. Risks include, but not limited to, infection, bleeding, blood clot (thrombus formation), and puncture of an artery; nerve damage and irregular heartbeat and possibility to perform a PICC exchange if needed/ordered by physician.  Alternatives to this procedure were also discussed.  Bard Power PICC patient education guide, fact sheet on infection prevention and patient information card has been provided to patient /or left at bedside.  Son at bedside gave consent due to sedated pt.  PICC Placement Documentation  PICC Triple Lumen 07/26/22 Right Cephalic 38 cm 0 cm (Active)  Indication for Insertion or Continuance of Line Vasoactive infusions;Prolonged intravenous therapies;Limited venous access - need for IV therapy >5 days (PICC only) 07/26/22 1312  Exposed Catheter (cm) 0 cm 07/26/22 1312  Site Assessment Clean, Dry, Intact 07/26/22 1312  Lumen #1 Status Flushed;Saline locked;Blood return noted 07/26/22 1312  Lumen #2 Status Flushed;Saline locked;Blood return noted 07/26/22 1312  Lumen #3 Status Flushed;Saline locked;Blood return noted 07/26/22 1312  Dressing Type Transparent;Securing device 07/26/22 1312  Dressing Status Antimicrobial disc in place;Clean, Dry, Intact 07/26/22 1312  Safety Lock Not Applicable 07/26/22 1312  Line Care Connections checked and tightened 07/26/22 1312  Line Adjustment (NICU/IV Team Only) No 07/26/22 1312  Dressing Intervention New dressing 07/26/22 1312  Dressing Change Due 08/02/22 07/26/22 1312       Elliot Dally 07/26/2022, 1:12 PM

## 2022-07-27 ENCOUNTER — Inpatient Hospital Stay (HOSPITAL_COMMUNITY)
Admit: 2022-07-27 | Discharge: 2022-07-27 | Disposition: A | Discharge status: Home / Self Care | Payer: Medicaid Other | Attending: Pulmonary Disease | Admitting: Pulmonary Disease

## 2022-07-27 DIAGNOSIS — J441 Chronic obstructive pulmonary disease with (acute) exacerbation: Secondary | ICD-10-CM | POA: Diagnosis not present

## 2022-07-27 DIAGNOSIS — J9622 Acute and chronic respiratory failure with hypercapnia: Secondary | ICD-10-CM | POA: Diagnosis not present

## 2022-07-27 DIAGNOSIS — J189 Pneumonia, unspecified organism: Secondary | ICD-10-CM | POA: Diagnosis not present

## 2022-07-27 DIAGNOSIS — I5031 Acute diastolic (congestive) heart failure: Secondary | ICD-10-CM | POA: Diagnosis not present

## 2022-07-27 DIAGNOSIS — J9621 Acute and chronic respiratory failure with hypoxia: Secondary | ICD-10-CM

## 2022-07-27 LAB — ECHOCARDIOGRAM COMPLETE
AR max vel: 2.38 cm2
AV Area VTI: 2.29 cm2
AV Area mean vel: 1.84 cm2
AV Mean grad: 5 mmHg
AV Peak grad: 8.3 mmHg
Ao pk vel: 1.44 m/s
Area-P 1/2: 4.99 cm2
Height: 60 in
S' Lateral: 2.6 cm
Weight: 3111.13 oz

## 2022-07-27 LAB — BASIC METABOLIC PANEL
BUN: 27 mg/dL — ABNORMAL HIGH (ref 8–23)
CO2: >45 mmol/L — ABNORMAL HIGH (ref 22–32)
Calcium: 9.1 mg/dL (ref 8.9–10.3)
Chloride: 91 mmol/L — ABNORMAL LOW (ref 98–111)
Creatinine, Ser: 0.47 mg/dL (ref 0.44–1.00)
GFR, Estimated: >60 mL/min (ref >60)
Glucose, Bld: 128 mg/dL — ABNORMAL HIGH (ref 70–99)
Potassium: 3.8 mmol/L (ref 3.5–5.1)
Sodium: 144 mmol/L (ref 135–145)

## 2022-07-27 LAB — CULTURE, BLOOD (ROUTINE X 2)
Culture: NO GROWTH
Culture: NO GROWTH
Special Requests: ADEQUATE

## 2022-07-27 LAB — MAGNESIUM: Magnesium: 2.4 mg/dL (ref 1.7–2.4)

## 2022-07-27 LAB — CBC
HCT: 36.5 % (ref 36.0–46.0)
Hemoglobin: 10.7 g/dL — ABNORMAL LOW (ref 12.0–15.0)
MCH: 21.3 pg — ABNORMAL LOW (ref 26.0–34.0)
MCHC: 29.3 g/dL — ABNORMAL LOW (ref 30.0–36.0)
MCV: 72.6 fL — ABNORMAL LOW (ref 80.0–100.0)
Platelets: 219 10*3/uL (ref 150–400)
RBC: 5.03 MIL/uL (ref 3.87–5.11)
RDW: 16.3 % — ABNORMAL HIGH (ref 11.5–15.5)
WBC: 13.9 10*3/uL — ABNORMAL HIGH (ref 4.0–10.5)
nRBC: 0 % (ref 0.0–0.2)

## 2022-07-27 LAB — TRIGLYCERIDES: Triglycerides: 138 mg/dL (ref <150)

## 2022-07-27 LAB — BLOOD GAS, ARTERIAL
Acid-Base Excess: 26.3 mmol/L — ABNORMAL HIGH (ref 0.0–2.0)
Bicarbonate: 54.3 mmol/L — ABNORMAL HIGH (ref 20.0–28.0)
FIO2: 40 %
MECHVT: 400 mL
Mechanical Rate: 20
O2 Saturation: 99.5 %
PEEP: 5 cmH2O
Patient temperature: 37
pCO2 arterial: 68 mmHg (ref 32–48)
pH, Arterial: 7.51 — ABNORMAL HIGH (ref 7.35–7.45)
pO2, Arterial: 113 mmHg — ABNORMAL HIGH (ref 83–108)

## 2022-07-27 LAB — GLUCOSE, CAPILLARY
Glucose-Capillary: 147 mg/dL — ABNORMAL HIGH (ref 70–99)
Glucose-Capillary: 119 mg/dL — ABNORMAL HIGH (ref 70–99)
Glucose-Capillary: 103 mg/dL — ABNORMAL HIGH (ref 70–99)
Glucose-Capillary: 164 mg/dL — ABNORMAL HIGH (ref 70–99)
Glucose-Capillary: 107 mg/dL — ABNORMAL HIGH (ref 70–99)
Glucose-Capillary: 126 mg/dL — ABNORMAL HIGH (ref 70–99)

## 2022-07-27 LAB — PHOSPHORUS: Phosphorus: 4.3 mg/dL (ref 2.5–4.6)

## 2022-07-27 MED ORDER — MIDAZOLAM HCL 2 MG/2ML IJ SOLN
2.0000 mg | Freq: Once | INTRAMUSCULAR | Status: AC
  Administered 2022-07-27: 2 mg via INTRAVENOUS
  Filled 2022-07-27: qty 2

## 2022-07-27 MED ORDER — PANTOPRAZOLE SODIUM 40 MG PO TBEC: 40.0000 mg | DELAYED_RELEASE_TABLET | Freq: Every day | ORAL | Status: DC

## 2022-07-27 MED ORDER — DEXMEDETOMIDINE HCL IN NACL 400 MCG/100ML IV SOLN
0.4000 ug/kg/h | INTRAVENOUS | Status: DC
  Administered 2022-07-27 – 2022-07-28 (×5): 1.2 ug/kg/h via INTRAVENOUS
  Administered 2022-07-28: 1 ug/kg/h via INTRAVENOUS
  Administered 2022-07-28 (×2): 1.2 ug/kg/h via INTRAVENOUS
  Administered 2022-07-28: 1 ug/kg/h via INTRAVENOUS
  Administered 2022-07-28: 1.2 ug/kg/h via INTRAVENOUS
  Administered 2022-07-29: 0.5 ug/kg/h via INTRAVENOUS
  Administered 2022-07-29: 0.8 ug/kg/h via INTRAVENOUS
  Administered 2022-07-29: 1.2 ug/kg/h via INTRAVENOUS
  Administered 2022-07-29: 1 ug/kg/h via INTRAVENOUS
  Administered 2022-07-30 (×2): 1.2 ug/kg/h via INTRAVENOUS
  Filled 2022-07-27 (×17): qty 100

## 2022-07-27 MED ORDER — SODIUM CHLORIDE 0.9 % IV SOLN
2.0000 g | INTRAVENOUS | Status: AC
  Administered 2022-07-27 – 2022-07-28 (×2): 2 g via INTRAVENOUS
  Filled 2022-07-27: qty 20
  Filled 2022-07-27: qty 2

## 2022-07-27 MED ORDER — FAMOTIDINE 20 MG PO TABS
20.0000 mg | ORAL_TABLET | Freq: Every day | ORAL | Status: DC
  Administered 2022-07-28 – 2022-07-29 (×2): 20 mg
  Filled 2022-07-27 (×2): qty 1

## 2022-07-27 MED ORDER — FENTANYL CITRATE PF 50 MCG/ML IJ SOSY
50.0000 ug | PREFILLED_SYRINGE | Freq: Once | INTRAMUSCULAR | Status: AC
  Administered 2022-07-27: 50 ug via INTRAVENOUS
  Filled 2022-07-27: qty 1

## 2022-07-27 MED ORDER — IPRATROPIUM-ALBUTEROL 0.5-2.5 (3) MG/3ML IN SOLN
3.0000 mL | Freq: Three times a day (TID) | RESPIRATORY_TRACT | Status: DC
  Administered 2022-07-28 – 2022-07-31 (×10): 3 mL via RESPIRATORY_TRACT
  Filled 2022-07-27 (×10): qty 3

## 2022-07-27 MED ORDER — METHYLPREDNISOLONE SODIUM SUCC 40 MG IJ SOLR
40.0000 mg | INTRAMUSCULAR | Status: DC
  Administered 2022-07-28 – 2022-07-29 (×2): 40 mg via INTRAVENOUS
  Filled 2022-07-27 (×2): qty 1

## 2022-07-27 MED ORDER — LACTATED RINGERS IV BOLUS
500.0000 mL | Freq: Once | INTRAVENOUS | Status: AC
  Administered 2022-07-27: 500 mL via INTRAVENOUS

## 2022-07-27 NOTE — Progress Notes (Signed)
PHARMACY CONSULT NOTE  Pharmacy Consult for Electrolyte Monitoring and Replacement   Recent Labs: Potassium (mmol/L)  Date Value  07/27/2022 3.8   Magnesium (mg/dL)  Date Value  41/28/2081 2.4   Calcium (mg/dL)  Date Value  38/87/1960 9.1   Albumin (g/dL)  Date Value  74/71/8550 3.4 (L)   Phosphorus (mg/dL)  Date Value  15/86/8257 4.3   Sodium (mmol/L)  Date Value  07/27/2022 144   Assessment: 62 y/o female with h/o COPD who is admitted with COPD exacerbation, RSV and CAP.   Pt sedated and ventilated. OGT in place. On tube feeds.  Goal of Therapy:  Electrolytes within normal limits  Plan:  UOP 0.5>>2.9 ml/k/h; Scr 0.35>0.47 --No electrolyte replacement warranted for today --Follow-up electrolytes with AM labs tomorrow  Martyn Malay 07/27/2022 11:53 AM

## 2022-07-27 NOTE — Progress Notes (Signed)
*  PRELIMINARY RESULTS* Echocardiogram 2D Echocardiogram has been performed.  Aimee Hooper 07/27/2022, 8:37 AM

## 2022-07-27 NOTE — Progress Notes (Signed)
NAME:  Aimee Hooper, MRN:  [id number], DOB:  1961/03/21, LOS: 5 ADMISSION DATE:  07/22/2022  CHIEF COMPLAINT:  severe resp distress  BRIEF SYNOPSIS 61 yo obese female with RSV pneumonia, severe COPD exacerbation, superimposed Klebsiella Pneumoniae pneumonia, SEPSIS POA  History of Present Illness:  61 y.o. female history of COPD and asthma not on home O2 per patient report who was diagnosed with RSV yesterday who presents to the emergency department in severe  respiratory distress.  extensive Wheezing, shortness of breath starting this morning.   Severe increased WOB and using accessory muscles to breathe   ER Course EMS personnel who report giving 125 IV Solu-Medrol, IV magnesium, 3 DuoNebs while in route to the emergency department. WBC 28 HBG 13 TROP 11  Placed on BiPAP.  PCCM asked to admit for further workup and treatment.   Please see "significant hospital events" section below for full detailed hospital course.    Pertinent  Medical History  Stage 3 Severe COPD Asthma  Obesity  Emphysema of Lung   Significant Hospital Events: Including procedures, antibiotic start and stop dates in addition to other pertinent events   11/15: Admitted to ICU for severe COPD exacerbation, on BiPAP 11/16: Pt remains on Bipap _0 % requiring precedex gtt and prn ativan due to intermittent anxiety/agitation.  Ultimately required INTUBATION & MECHANICAL VENTILATION 11/17:  Requiring low dose peripheral Levophed due to sedation.  Lung sounds remain diminished with slight wheeze, will rest on vent today with no SBT. 11/18: Slight improvement in air entry bilaterally.  Still with evidence of bronchoconstriction as Peak pressures in mid 30's, exercised in [facility] for about 30 minutes, then having episodes of apnea, so placed back on full support. Leukocytosis slowly improving.  Tracheal aspirate still pending 11/19: Increased Peak pressures up to 50, tried in PC but did not help, therefore  placed back in [facility] with reduced TV's. Diuresed, multiple albuterol nebs and Magnesium given.  Tracheal aspirate with Klebsiella, ABX changed to Levaquin. 11/20: Pt with metabolic alkalosis, holding lasix.  Micro Data:  Resp Panel by RT-PCR (Flu A&B, Covid) 11/15>>negative  MRSA PCR 11/15>>negative  Blood x2 11/15>> no growth to date Tracheal aspirate 11/16>>Klebsiella Pneumoniae (resistant to Ampicillin)  Antimicrobials:   Antibiotics Given (last 72 hours)     Date/Time Action Medication Dose Rate   07/24/22 1056 New Bag/Given   cefTRIAXone (ROCEPHIN) 2 g in sodium chloride 0.9 % 100 mL IVPB 2 g 200 mL/hr   07/24/22 1131 New Bag/Given   azithromycin (ZITHROMAX) 500 mg in sodium chloride 0.9 % 250 mL IVPB 500 mg 250 mL/hr   07/25/22 2993 New Bag/Given   cefTRIAXone (ROCEPHIN) 2 g in sodium chloride 0.9 % 100 mL IVPB 2 g 200 mL/hr   07/25/22 1055 New Bag/Given   azithromycin (ZITHROMAX) 500 mg in sodium chloride 0.9 % 250 mL IVPB 500 mg 250 mL/hr   07/26/22 7169 New Bag/Given   cefTRIAXone (ROCEPHIN) 2 g in sodium chloride 0.9 % 100 mL IVPB 2 g 200 mL/hr   07/26/22 0941 New Bag/Given   levofloxacin (LEVAQUIN) IVPB 750 mg 750 mg 100 mL/hr      Subjective:   -No significant events noted overnight -Afebrile, hemodynamically stable, no pressors -Diuresed 6L yesterday, now with severe metabolic alkalosis ~ hold diuresis today -Tracheal aspirate with Klebsiella pneumoniae ~ now with sensitivities back, will place back on Ceftriaxone  Objective   Blood pressure 132/66, pulse 97, temperature 98 F (36.7 C), temperature source Oral, resp. rate 20, height  5' (1.524 m), weight 88.2 kg, SpO2 96 %.    Vent Mode: PRVC FiO2 (%):  [40 %] 40 % Set Rate:  [16 bmp-20 bmp] 20 bmp Vt Set:  [380 mL-400 mL] 400 mL PEEP:  [5 cmH20] 5 cmH20 Plateau Pressure:  [26 cmH20] 26 cmH20   Intake/Output Summary (Last 24 hours) at 07/27/2022 3810 Last data filed at 07/27/2022 0800 Gross per 24 hour   Intake 2633.04 ml  Output 6150 ml  Net -3516.96 ml    Filed Weights   07/25/22 0443 07/26/22 0459 07/27/22 0342  Weight: 92.4 kg 96.4 kg 88.2 kg    Labs/imaging that I have personally reviewed  (right click and "Reselect all SmartList Selections" daily)   Examination: General: Acute on chronically ill-appearing female , intubated and sedated, no acute distress HENT: Atraumatic, normocephalic, neck supple, no JVD Lungs: Diminished with expiratory wheezes throughout, even, occasionally overbreathes the vent Cardiovascular: Tachycardia, regular rhythm (sinus tach on telemetry), no M/R/G, 2+ radial/1+ distal pulses, no edema  Abdomen: +BS x4, soft, non distended, obese   Extremities: Generalized weakness, no edema, no cyanosis, warm extremities Neuro: Sedated, has purposeful movement but currently not following commands, pupils PERRL GU: foley in place draining yellow urine   ASSESSMENT AND PLAN  Acute Hypoxic Respiratory Failure in the setting of RSV pneumonia & Klebsiella Pneumoniae Pneumonia, along with severe COPD/Asthma exacerbation -Full vent support, implement lung protective strategies -Plateau pressures less than 30 cm H20 -Wean FiO2 & PEEP as tolerated to maintain O2 sats >92% -Follow intermittent Chest X-ray & ABG as needed -Spontaneous Breathing Trials when respiratory parameters met and mental status permits -Implement VAP Bundle -Bronchodilators & Pulmicort nebs -IV steroids (Solumedrol 40 mg BID) -ABX as above -Diuresis as BP and renal function permits to maintain net even fluid balance ~ hold diuresis 11/20 due to contraction alkalosis  RSV Pneumonia Superimposed Klebsiella Pneumoniae Pneumonia -Monitor fever curve -Trend WBC's & Procalcitonin -Follow cultures as above -Continue empiric Ceftriaxone pending cultures & sensitivities  Hypotension, suspect related to Sedation +/- Sepsis ~ RESOLVED Echocardiogram 07/27/22: LVEF 60-65%, mild LVH, normal diastolic  parameters, RV systolic function normal -Continuous cardiac monitoring -Maintain MAP >65 -IV fluids -Vasopressors as needed to maintain MAP goal ~ currently not requiring -Lactic acid normal x2 -HS Troponin negative x2  Metabolic Alkalosis, suspect Contraction alkalosis in setting of diuresis -Monitor I&O's / urinary output -Follow BMP -Ensure adequate renal perfusion -Avoid nephrotoxic agents as able -Replace electrolytes as indicated -Hold diuresis 11/20 ~ will consider gentle IVF  Mild anemia without obvious signs of bleeding  - Trend CBC - Monitor for s/sx of bleeding and transfuse for hgb <7  Sedation needs in setting of mechanical ventilation -Maintain a RASS goal of 0 to -1 -Fentanyl and Propofol as needed to maintain RASS goal -Avoid sedating medications as able -Daily wake up assessment    Pt is critically ill, high risk for decompensation, cardiac arrest, and death.  Given severe COPD, ancipitate pt likely be difficult to liberate from mechanical ventilation and may require tracheostomy.   Best practice (right click and "Reselect all SmartList Selections" daily)  Diet: NPO, tube feeds DVT prophylaxis: LMWH GI prophylaxis: PPI Mobility:  bed rest  Code Status:  FULL Disposition: ICU  11/20: Updated pt's son Randall Hiss at bedside.  Labs   CBC: Recent Labs  Lab 07/22/22 0757 07/23/22 0710 07/24/22 0506 07/25/22 0431 07/26/22 0428 07/27/22 0445  WBC 28.6* 11.8* 15.1* 14.4* 12.7* 13.9*  NEUTROABS 18.6*  --   --   --   --   --  HGB 13.2 11.7* 12.1 10.8* 10.0* 10.7*  HCT 43.5 39.3 40.7 36.4 33.9* 36.5  MCV 70.4* 71.2* 71.7* 72.8* 72.7* 72.6*  PLT 378 251 296 262 208 219     Basic Metabolic Panel: Recent Labs  Lab 07/23/22 0710 07/24/22 0506 07/25/22 0431 07/26/22 0428 07/26/22 1939 07/27/22 0445  NA 143 142 141 143 143 144  K 4.6 4.4 4.5 4.4 3.5 3.8  CL 106 107 104 102 89* 91*  CO2 _0 35* >45* >45*  GLUCOSE 125* 147* 165* 127* 106* 128*   BUN 17 24* _1 27*  CREATININE 0.46 0.61 0.40* 0.35* 0.49 0.47  CALCIUM 8.9 8.8* 8.8* 8.5* 8.7* 9.1  MG 2.8* 2.3 2.3 2.3  --  2.4  PHOS 4.3 3.9 3.0 3.5  --  4.3    GFR: Estimated Creatinine Clearance: 72.1 mL/min (by C-G formula based on SCr of 0.47 mg/dL). Recent Labs  Lab 07/22/22 0932 07/22/22 1119 07/23/22 0710 07/24/22 0506 07/25/22 0431 07/26/22 0428 07/27/22 0445  WBC  --   --    < > 15.1* 14.4* 12.7* 13.9*  LATICACIDVEN 1.4 1.6  --   --   --   --   --    < > = values in this interval not displayed.     Liver Function Tests: Recent Labs  Lab 07/24/22 0506  AST 36  ALT 44  ALKPHOS 57  BILITOT 0.8  PROT 6.5  ALBUMIN 3.4*    No results for input(s): "LIPASE", "AMYLASE" in the last 168 hours. No results for input(s): "AMMONIA" in the last 168 hours.  ABG    Component Value Date/Time   PHART 7.51 (H) 07/27/2022 0719   PCO2ART 68 (HH) 07/27/2022 0719   PO2ART 113 (H) 07/27/2022 0719   HCO3 54.3 (H) 07/27/2022 0719   O2SAT 99.5 07/27/2022 0719     Coagulation Profile: No results for input(s): "INR", "PROTIME" in the last 168 hours.  Cardiac Enzymes: No results for input(s): "CKTOTAL", "CKMB", "CKMBINDEX", "TROPONINI" in the last 168 hours.  HbA1C: No results found for: "HGBA1C"  CBG: Recent Labs  Lab 07/26/22 1617 07/26/22 1953 07/26/22 2318 07/27/22 0325 07/27/22 0749  GLUCAP 119* 106* 128* 147* 119*    Review of Symptoms:  Unable to assess, pt in intubated/sedated/critically ill  Past Medical History:  She,  has a past medical history of Asthma, Emphysema lung ([place]), Obesity, and Stage 3 severe COPD by GOLD classification ([place]).   Surgical History:  History reviewed. No pertinent surgical history.   Social History:   reports that she has been smoking cigarettes. She has never used smokeless tobacco. She reports that she does not drink alcohol and does not use drugs.   Family History:  Her family history is not on file.    Allergies No Known Allergies   Home Medications  Prior to Admission medications   Medication Sig Start Date End Date Taking? Authorizing Provider  azithromycin (ZITHROMAX) 250 MG tablet Take 250 mg by mouth daily. 08/27/20  Yes [provider]  celecoxib (CELEBREX) 200 MG capsule Take 200 mg by mouth daily. Patient not taking: Reported on 07/22/2022 06/26/20   [provider]  DIFFERIN 0.1 % cream Apply 1 application topically at bedtime. Patient not taking: Reported on 07/22/2022 06/26/20   [provider]  loratadine (CLARITIN) 10 MG tablet Take 10 mg by mouth daily. Patient not taking: Reported on 07/22/2022 06/26/20   [provider]  nicotine (NICODERM CQ - DOSED IN  MG/24 HOURS) 21 mg/24hr patch Place 1 patch onto the skin daily. Patient not taking: Reported on 07/22/2022 07/24/20   [provider]  TRELEGY ELLIPTA 100-62.5-25 MCG/INH AEPB Take 1 puff by mouth daily. Patient not taking: Reported on 07/22/2022 08/06/20   [provider]       Critical Care Time: 40 minutes  Darel Hong, AGACNP-BC [facility] epic messenger for cross cover needs If after hours, please call E-link

## 2022-07-28 ENCOUNTER — Inpatient Hospital Stay: Payer: Medicaid Other

## 2022-07-28 DIAGNOSIS — J441 Chronic obstructive pulmonary disease with (acute) exacerbation: Secondary | ICD-10-CM

## 2022-07-28 DIAGNOSIS — J189 Pneumonia, unspecified organism: Secondary | ICD-10-CM | POA: Diagnosis not present

## 2022-07-28 DIAGNOSIS — J9622 Acute and chronic respiratory failure with hypercapnia: Secondary | ICD-10-CM | POA: Diagnosis not present

## 2022-07-28 DIAGNOSIS — J9621 Acute and chronic respiratory failure with hypoxia: Secondary | ICD-10-CM | POA: Diagnosis not present

## 2022-07-28 LAB — BLOOD GAS, ARTERIAL
Acid-Base Excess: 18.7 mmol/L — ABNORMAL HIGH (ref 0.0–2.0)
Acid-Base Excess: 22.7 mmol/L — ABNORMAL HIGH (ref 0.0–2.0)
Bicarbonate: 48.5 mmol/L — ABNORMAL HIGH (ref 20.0–28.0)
Bicarbonate: 52.6 mmol/L — ABNORMAL HIGH (ref 20.0–28.0)
FIO2: 40 %
FIO2: 40 %
MECHVT: 380 mL
MECHVT: 400 mL
Mechanical Rate: 16
Mechanical Rate: 16
O2 Saturation: 95.6 %
O2 Saturation: 98.4 %
PEEP: 5 cmH2O
PEEP: 5 cmH2O
Patient temperature: 37
Patient temperature: 37
pCO2 arterial: 82 mmHg (ref 32–48)
pCO2 arterial: 83 mmHg (ref 32–48)
pH, Arterial: 7.38 (ref 7.35–7.45)
pH, Arterial: 7.41 (ref 7.35–7.45)
pO2, Arterial: 70 mmHg — ABNORMAL LOW (ref 83–108)
pO2, Arterial: 81 mmHg — ABNORMAL LOW (ref 83–108)

## 2022-07-28 LAB — CBC
HCT: 35.1 % — ABNORMAL LOW (ref 36.0–46.0)
Hemoglobin: 10.4 g/dL — ABNORMAL LOW (ref 12.0–15.0)
MCH: 21.6 pg — ABNORMAL LOW (ref 26.0–34.0)
MCHC: 29.6 g/dL — ABNORMAL LOW (ref 30.0–36.0)
MCV: 73 fL — ABNORMAL LOW (ref 80.0–100.0)
Platelets: 207 10*3/uL (ref 150–400)
RBC: 4.81 MIL/uL (ref 3.87–5.11)
RDW: 15.9 % — ABNORMAL HIGH (ref 11.5–15.5)
WBC: 17.1 10*3/uL — ABNORMAL HIGH (ref 4.0–10.5)
nRBC: 0 % (ref 0.0–0.2)

## 2022-07-28 LAB — GLUCOSE, CAPILLARY
Glucose-Capillary: 108 mg/dL — ABNORMAL HIGH (ref 70–99)
Glucose-Capillary: 109 mg/dL — ABNORMAL HIGH (ref 70–99)
Glucose-Capillary: 128 mg/dL — ABNORMAL HIGH (ref 70–99)
Glucose-Capillary: 131 mg/dL — ABNORMAL HIGH (ref 70–99)
Glucose-Capillary: 140 mg/dL — ABNORMAL HIGH (ref 70–99)
Glucose-Capillary: 148 mg/dL — ABNORMAL HIGH (ref 70–99)

## 2022-07-28 LAB — BASIC METABOLIC PANEL
Anion gap: 5 (ref 5–15)
BUN: 30 mg/dL — ABNORMAL HIGH (ref 8–23)
CO2: 43 mmol/L — ABNORMAL HIGH (ref 22–32)
Calcium: 8.9 mg/dL (ref 8.9–10.3)
Chloride: 98 mmol/L (ref 98–111)
Creatinine, Ser: 0.42 mg/dL — ABNORMAL LOW (ref 0.44–1.00)
GFR, Estimated: >60 mL/min (ref >60)
Glucose, Bld: 119 mg/dL — ABNORMAL HIGH (ref 70–99)
Potassium: 3.9 mmol/L (ref 3.5–5.1)
Sodium: 146 mmol/L — ABNORMAL HIGH (ref 135–145)

## 2022-07-28 LAB — PHOSPHORUS: Phosphorus: 3 mg/dL (ref 2.5–4.6)

## 2022-07-28 LAB — MAGNESIUM: Magnesium: 2.4 mg/dL (ref 1.7–2.4)

## 2022-07-28 MED ORDER — LORAZEPAM 2 MG/ML IJ SOLN
1.0000 mg | Freq: Once | INTRAMUSCULAR | Status: AC
  Administered 2022-07-28: 1 mg via INTRAVENOUS

## 2022-07-28 MED ORDER — POLYVINYL ALCOHOL 1.4 % OP SOLN
1.0000 [drp] | OPHTHALMIC | Status: DC | PRN
  Administered 2022-07-29 – 2022-08-01 (×3): 1 [drp] via OPHTHALMIC
  Filled 2022-07-28: qty 15

## 2022-07-28 MED ORDER — SODIUM CHLORIDE 0.45 % IV SOLN: Freq: Once | INTRAVENOUS | Status: AC

## 2022-07-28 MED ORDER — VITAL AF 1.2 CAL PO LIQD
1000.0000 mL | ORAL | Status: DC
  Administered 2022-07-28 – 2022-07-29 (×2): 1000 mL

## 2022-07-28 MED ORDER — LORAZEPAM 2 MG/ML IJ SOLN
INTRAMUSCULAR | Status: AC
  Filled 2022-07-28: qty 1

## 2022-07-28 NOTE — Progress Notes (Signed)
NAME:  Aimee Hooper, MRN:  [id number], DOB:  11-Apr-1961, LOS: 6 ADMISSION DATE:  07/22/2022  CHIEF COMPLAINT:  severe resp distress  BRIEF SYNOPSIS 61 yo obese female with RSV pneumonia, severe COPD exacerbation, superimposed Klebsiella Pneumoniae pneumonia, SEPSIS POA  History of Present Illness:  61 y.o. female history of COPD and asthma not on home O2 per patient report who was diagnosed with RSV yesterday who presents to the emergency department in severe  respiratory distress.  extensive Wheezing, shortness of breath starting this morning.   Severe increased WOB and using accessory muscles to breathe   ER Course EMS personnel who report giving 125 IV Solu-Medrol, IV magnesium, 3 DuoNebs while in route to the emergency department. WBC 28 HBG 13 TROP 11  Placed on BiPAP.  PCCM asked to admit for further workup and treatment.   Please see "significant hospital events" section below for full detailed hospital course.    Pertinent  Medical History  Stage 3 Severe COPD Asthma  Obesity  Emphysema of Lung   Significant Hospital Events: Including procedures, antibiotic start and stop dates in addition to other pertinent events   11/15: Admitted to ICU for severe COPD exacerbation, on BiPAP 11/16: Pt remains on Bipap _0 % requiring precedex gtt and prn ativan due to intermittent anxiety/agitation.  Ultimately required INTUBATION & MECHANICAL VENTILATION 11/17:  Requiring low dose peripheral Levophed due to sedation.  Lung sounds remain diminished with slight wheeze, will rest on vent today with no SBT. 11/18: Slight improvement in air entry bilaterally.  Still with evidence of bronchoconstriction as Peak pressures in mid 30's, exercised in [facility] for about 30 minutes, then having episodes of apnea, so placed back on full support. Leukocytosis slowly improving.  Tracheal aspirate still pending 11/19: Increased Peak pressures up to 50, tried in PC but did not help, therefore  placed back in [facility] with reduced TV's. Diuresed, multiple albuterol nebs and Magnesium given.  Tracheal aspirate with Klebsiella, ABX changed to Levaquin. 11/20: Pt with metabolic alkalosis, holding lasix. 20/25: Metabolic alkalosis slowly improving plans for WUA and SBT today   Micro Data:  Resp Panel by RT-PCR (Flu A&B, Covid) 11/15>>negative  MRSA PCR 11/15>>negative  Blood x2 11/15>> no growth to date Tracheal aspirate 11/16>>Klebsiella Pneumoniae (resistant to Ampicillin)  Antimicrobials:   Antibiotics Given (last 72 hours)     Date/Time Action Medication Dose Rate   07/25/22 1055 New Bag/Given   azithromycin (ZITHROMAX) 500 mg in sodium chloride 0.9 % 250 mL IVPB 500 mg 250 mL/hr   07/26/22 4270 New Bag/Given   cefTRIAXone (ROCEPHIN) 2 g in sodium chloride 0.9 % 100 mL IVPB 2 g 200 mL/hr   07/26/22 0941 New Bag/Given   levofloxacin (LEVAQUIN) IVPB 750 mg 750 mg 100 mL/hr   07/27/22 1111 New Bag/Given   cefTRIAXone (ROCEPHIN) 2 g in sodium chloride 0.9 % 100 mL IVPB 2 g 200 mL/hr   07/28/22 0920 New Bag/Given   cefTRIAXone (ROCEPHIN) 2 g in sodium chloride 0.9 % 100 mL IVPB 2 g 200 mL/hr      Subjective:   No acute events overnight.  Remains on minimal ventilator settings PEEP 5/FiO2 40%  Objective   Blood pressure (!) 101/50, pulse 88, temperature 99.5 F (37.5 C), temperature source Oral, resp. rate (!) 22, height 5' (1.524 m), weight 92.3 kg, SpO2 95 %.    Vent Mode: PRVC FiO2 (%):  [40 %] 40 % Set Rate:  [16 bmp] 16 bmp Vt Set:  [400 mL-500  mL] 500 mL PEEP:  [5 [place] Pressure:  [17 cmH20-29 cmH20] 29 cmH20   Intake/Output Summary (Last 24 hours) at 07/28/2022 0947 Last data filed at 07/28/2022 0600 Gross per 24 hour  Intake 2487.17 ml  Output 2000 ml  Net 487.17 ml   Filed Weights   07/26/22 0459 07/27/22 0342 07/28/22 0429  Weight: 96.4 kg 88.2 kg 92.3 kg    Labs/imaging that I have personally reviewed  (right click and "Reselect all  SmartList Selections" daily)   Examination: General: Acute on chronically ill-appearing female , intubated and sedated, NAD  HENT: Atraumatic, normocephalic, neck supple, no JVD Lungs: Diminished throughout, even, non labored Cardiovascular: NSR, rrr, no m/r/g, 2+ radial/2+ distal pulses, no edema  Abdomen: +BS x4, soft, obese, non distended  Extremities: Normal bulk and tone  Neuro: Sedated, not following commands, PERRL GU: foley in place draining yellow urine  ASSESSMENT AND PLAN  Acute Hypoxic Respiratory Failure in the setting of RSV pneumonia & Klebsiella Pneumoniae Pneumonia, along with severe COPD/Asthma exacerbation - Full vent support, implement lung protective strategies - Plateau pressures less than 30 cm H20 - Wean FiO2 & PEEP as tolerated to maintain O2 sats >92% - Follow intermittent Chest X-ray & ABG as needed - Spontaneous Breathing Trials when respiratory parameters met and mental status permits - Implement VAP Bundle - Bronchodilators & Pulmicort nebs - IV steroids daily  - ABX as above  RSV Pneumonia Superimposed Klebsiella Pneumoniae Pneumonia - Trend WBC and monitor fever curve - Trend PCT - Continue Ceftriaxone  Hypotension, suspect related to Sedation +/- Sepsis ~ RESOLVED Echocardiogram 07/27/22: LVEF 60-65%, mild LVH, normal diastolic parameters, RV systolic function normal - Continuous cardiac monitoring - Not requiring vasopressors maintain map >62  Metabolic Alkalosis, suspect Contraction alkalosis in setting of diuresis~improving  - Trend BMP  - Replace electrolytes as indicated  - Monitor UOP  - Hold diuresis 11/20: will give 500 ml 1/2 NS bolus  Mild anemia without obvious signs of bleeding  - Trend CBC - Monitor for s/sx of bleeding and transfuse for hgb <7  Sedation needs in setting of mechanical ventilation - Maintain a RASS goal of 0 to -1 - Precedex gtt and prn fentanyl to maintain RASS goal  - Daily wake up assessment  Pt is  critically ill, high risk for decompensation, cardiac arrest, and death.  Given severe COPD, ancipitate pt likely be difficult to liberate from mechanical ventilation and may require tracheostomy. Best practice (right click and "Reselect all SmartList Selections" daily)  Diet: NPO, tube feeds DVT prophylaxis: LMWH GI prophylaxis: PPI Mobility:  bed rest  Code Status:  FULL Disposition: ICU  11/21: Updated pt's son Randall Hiss at bedside. Labs   CBC: Recent Labs  Lab 07/22/22 0757 07/23/22 0710 07/24/22 0506 07/25/22 0431 07/26/22 0428 07/27/22 0445 07/28/22 0430  WBC 28.6*   < > 15.1* 14.4* 12.7* 13.9* 17.1*  NEUTROABS 18.6*  --   --   --   --   --   --   HGB 13.2   < > 12.1 10.8* 10.0* 10.7* 10.4*  HCT 43.5   < > 40.7 36.4 33.9* 36.5 35.1*  MCV 70.4*   < > 71.7* 72.8* 72.7* 72.6* 73.0*  PLT 378   < > 296 262 208 219 207   < > = values in this interval not displayed.    Basic Metabolic Panel: Recent Labs  Lab 07/24/22 0506 07/25/22 0431 07/26/22 0428 07/26/22 1939 07/27/22 0445 07/28/22 0430  NA 142 141 143 143 144 146*  K 4.4 4.5 4.4 3.5 3.8 3.9  CL 107 104 102 89* 91* 98  CO2 30 30 35* >45* >45* 43*  GLUCOSE 147* 165* 127* 106* 128* 119*  BUN 24* _0 27* 30*  CREATININE 0.61 0.40* 0.35* 0.49 0.47 0.42*  CALCIUM 8.8* 8.8* 8.5* 8.7* 9.1 8.9  MG 2.3 2.3 2.3  --  2.4 2.4  PHOS 3.9 3.0 3.5  --  4.3 3.0   GFR: Estimated Creatinine Clearance: 73.9 mL/min (A) (by C-G formula based on SCr of 0.42 mg/dL (L)). Recent Labs  Lab 07/22/22 0932 07/22/22 1119 07/23/22 0710 07/25/22 0431 07/26/22 0428 07/27/22 0445 07/28/22 0430  WBC  --   --    < > 14.4* 12.7* 13.9* 17.1*  LATICACIDVEN 1.4 1.6  --   --   --   --   --    < > = values in this interval not displayed.    Liver Function Tests: Recent Labs  Lab 07/24/22 0506  AST 36  ALT 44  ALKPHOS 57  BILITOT 0.8  PROT 6.5  ALBUMIN 3.4*   No results for input(s): "LIPASE", "AMYLASE" in the last 168 hours. No  results for input(s): "AMMONIA" in the last 168 hours.  ABG    Component Value Date/Time   PHART 7.45 07/28/2022 0835   PCO2ART 71 (HH) 07/28/2022 0835   PO2ART 96 07/28/2022 0835   HCO3 49.3 (H) 07/28/2022 0835   O2SAT 99.1 07/28/2022 0835     Coagulation Profile: No results for input(s): "INR", "PROTIME" in the last 168 hours.  Cardiac Enzymes: No results for input(s): "CKTOTAL", "CKMB", "CKMBINDEX", "TROPONINI" in the last 168 hours.  HbA1C: No results found for: "HGBA1C"  CBG: Recent Labs  Lab 07/27/22 1131 07/27/22 1530 07/27/22 1944 07/27/22 2310 07/28/22 0743  GLUCAP 103* 164* 107* 126* 109*   Review of Symptoms:  Unable to assess, pt in intubated/sedated/critically ill  Past Medical History:  She,  has a past medical history of Asthma, Emphysema lung ([place]), Obesity, and Stage 3 severe COPD by GOLD classification ([place]).   Surgical History:  History reviewed. No pertinent surgical history.   Social History:   reports that she has been smoking cigarettes. She has never used smokeless tobacco. She reports that she does not drink alcohol and does not use drugs.   Family History:  Her family history is not on file.   Allergies No Known Allergies   Home Medications  Prior to Admission medications   Medication Sig Start Date End Date Taking? Authorizing Provider  azithromycin (ZITHROMAX) 250 MG tablet Take 250 mg by mouth daily. 08/27/20  Yes [provider]  celecoxib (CELEBREX) 200 MG capsule Take 200 mg by mouth daily. Patient not taking: Reported on 07/22/2022 06/26/20   [provider]  DIFFERIN 0.1 % cream Apply 1 application topically at bedtime. Patient not taking: Reported on 07/22/2022 06/26/20   [provider]  loratadine (CLARITIN) 10 MG tablet Take 10 mg by mouth daily. Patient not taking: Reported on 07/22/2022 06/26/20   [provider]  nicotine (NICODERM CQ - DOSED IN MG/24 HOURS) 21 mg/24hr patch Place 1  patch onto the skin daily. Patient not taking: Reported on 07/22/2022 07/24/20   [provider]  TRELEGY ELLIPTA 100-62.5-25 MCG/INH AEPB Take 1 puff by mouth daily. Patient not taking: Reported on 07/22/2022 08/06/20   [provider]       Critical Care Time: 40 minutes  Donell Beers, [place] Pager [phone] (please enter 7 digits) PCCM Consult Pager [phone] (please enter 7 digits)

## 2022-07-28 NOTE — Progress Notes (Signed)
Attempted to wake pt to attempt SBT, pt still very sleepy, HR up to 140s-150s, tachypneic and labored, family at bedside updated, sedation increased and PRN meds given.

## 2022-07-28 NOTE — Progress Notes (Signed)
PHARMACY CONSULT NOTE  Pharmacy Consult for Electrolyte Monitoring and Replacement   Recent Labs: Potassium (mmol/L)  Date Value  07/28/2022 3.9   Magnesium (mg/dL)  Date Value  69/79/4801 2.4   Calcium (mg/dL)  Date Value  65/53/7482 8.9   Albumin (g/dL)  Date Value  70/78/6754 3.4 (L)   Phosphorus (mg/dL)  Date Value  49/20/1007 3.0   Sodium (mmol/L)  Date Value  07/28/2022 146 (H)   Assessment: 61 y/o female with h/o COPD who is admitted with COPD exacerbation, RSV and CAP.   Pt sedated and ventilated. OGT in place. On tube feeds.  Goal of Therapy:  Electrolytes within normal limits  Plan:  UOP 0.5>>2.9>0.9 ml/k/h; Scr 0.47>1.42 --No electrolyte replacement warranted for today --Follow-up electrolytes with AM labs tomorrow  Martyn Malay 07/28/2022 8:43 AM

## 2022-07-28 NOTE — Progress Notes (Signed)
Nutrition Follow Up Note   DOCUMENTATION CODES:   Obesity unspecified  INTERVENTION:   Change to Vital 1.2 _0 /hr continuous   Free water flushes 48m q4 hours to maintain tube patency   Regimen provides 1728kcal/day, 108g/day protein and 1342mday of free water.   Daily weights   NUTRITION DIAGNOSIS:   Inadequate oral intake related to inability to eat (pt sedated and ventilated) as evidenced by NPO status.  GOAL:   Provide needs based on ASPEN/SCCM guidelines -met   MONITOR:   Vent status, Labs, Weight trends, Skin, I & O's, TF tolerance  ASSESSMENT:   89/o female with h/o COPD, chronic pain and marijuana use who is admitted with COPD exacerbation, RSV and CAP.  Pt remains sedated and ventilated. OGT in place and pt is tolerating tube feeds well at goal rate. Refeed labs stable. Pt with slight hypernatremia today. Per chart, pt is up 18lbs from her UBW. Pt +5.4L on her I & Os. Will hold off on increasing free water flushes as pt received a fluid bolus today. Plan is for WUA and SBTs today.   Medications reviewed and include: colace, lovenox, pepcid, solu-medrol, MVI, oxycodone, miralax  Labs reviewed: Na 146(H), K 3.9 wnl, BUN 30(H), creat 0.42(L), P 3.0 wnl, Mg 2.4 wnl Wbc- 17.1(H), Hgb 10.4(L), Hct 35.1(L), MCV 73.0(L), MCH 21.6(L), MCHC 29.6(L) Cbgs- 140, 109, 108 x 24 hrs  Patient is currently intubated on ventilator support MV: 10.0 L/min Temp (24hrs), Avg:99.2 F (37.3 C), Min:98.8 F (37.1 C), Max:99.5 F (37.5 C)  Propofol: none   MAP- >6557m   UOP- 2000m84mNUTRITION - FOCUSED PHYSICAL EXAM:  Flowsheet Row Most Recent Value  Orbital Region No depletion  Upper Arm Region Mild depletion  Thoracic and Lumbar Region No depletion  Buccal Region No depletion  Temple Region No depletion  Clavicle Bone Region No depletion  Clavicle and Acromion Bone Region No depletion  Scapular Bone Region No depletion  Dorsal Hand No depletion  Patellar  Region No depletion  Anterior Thigh Region No depletion  Posterior Calf Region Mild depletion  Edema (RD Assessment) Moderate  Hair Reviewed  Eyes Reviewed  Mouth Reviewed  Skin Reviewed  Nails Reviewed   Diet Order:   Diet Order             Diet NPO time specified  Diet effective now                  EDUCATION NEEDS:   No education needs have been identified at this time  Skin:  Skin Assessment: Reviewed RN Assessment  Last BM:  11/21- type 6  Height:   Ht Readings from Last 1 Encounters:  07/22/22 5' (1.524 m)    Weight:   Wt Readings from Last 1 Encounters:  07/28/22 92.3 kg    Ideal Body Weight:  45.4 kg  BMI:  Body mass index is 39.74 kg/m.  Estimated Nutritional Needs:   Kcal:  1700-2000kcal/day  Protein:  >90g/day  Fluid:  1.4-1.6L/day  CaseKoleen Distance RD, LDN Please refer to [facility] and/or RD on-call/weekend/after hours pager

## 2022-07-29 ENCOUNTER — Inpatient Hospital Stay: Payer: Medicaid Other

## 2022-07-29 DIAGNOSIS — J441 Chronic obstructive pulmonary disease with (acute) exacerbation: Secondary | ICD-10-CM | POA: Diagnosis not present

## 2022-07-29 DIAGNOSIS — J189 Pneumonia, unspecified organism: Secondary | ICD-10-CM | POA: Diagnosis not present

## 2022-07-29 DIAGNOSIS — J9621 Acute and chronic respiratory failure with hypoxia: Secondary | ICD-10-CM | POA: Diagnosis not present

## 2022-07-29 DIAGNOSIS — E873 Alkalosis: Secondary | ICD-10-CM | POA: Diagnosis not present

## 2022-07-29 LAB — BASIC METABOLIC PANEL
Anion gap: 4 — ABNORMAL LOW (ref 5–15)
BUN: 26 mg/dL — ABNORMAL HIGH (ref 8–23)
CO2: 37 mmol/L — ABNORMAL HIGH (ref 22–32)
Calcium: 8.8 mg/dL — ABNORMAL LOW (ref 8.9–10.3)
Chloride: 101 mmol/L (ref 98–111)
Creatinine, Ser: 0.38 mg/dL — ABNORMAL LOW (ref 0.44–1.00)
GFR, Estimated: >60 mL/min (ref >60)
Glucose, Bld: 120 mg/dL — ABNORMAL HIGH (ref 70–99)
Potassium: 3.7 mmol/L (ref 3.5–5.1)
Sodium: 142 mmol/L (ref 135–145)

## 2022-07-29 LAB — BLOOD GAS, ARTERIAL
Acid-Base Excess: 20.9 mmol/L — ABNORMAL HIGH (ref 0.0–2.0)
Bicarbonate: 49.3 mmol/L — ABNORMAL HIGH (ref 20.0–28.0)
FIO2: 40 %
Mechanical Rate: 16
O2 Saturation: 99.1 %
PEEP: 5 cmH2O
Patient temperature: 37
Spontaneous VT: 400 mL
pCO2 arterial: 71 mmHg (ref 32–48)
pH, Arterial: 7.45 (ref 7.35–7.45)
pO2, Arterial: 96 mmHg (ref 83–108)

## 2022-07-29 LAB — GLUCOSE, CAPILLARY
Glucose-Capillary: 123 mg/dL — ABNORMAL HIGH (ref 70–99)
Glucose-Capillary: 127 mg/dL — ABNORMAL HIGH (ref 70–99)
Glucose-Capillary: 133 mg/dL — ABNORMAL HIGH (ref 70–99)
Glucose-Capillary: 134 mg/dL — ABNORMAL HIGH (ref 70–99)
Glucose-Capillary: 138 mg/dL — ABNORMAL HIGH (ref 70–99)
Glucose-Capillary: 157 mg/dL — ABNORMAL HIGH (ref 70–99)

## 2022-07-29 LAB — CBC
HCT: 34.3 % — ABNORMAL LOW (ref 36.0–46.0)
Hemoglobin: 10.1 g/dL — ABNORMAL LOW (ref 12.0–15.0)
MCH: 21.4 pg — ABNORMAL LOW (ref 26.0–34.0)
MCHC: 29.4 g/dL — ABNORMAL LOW (ref 30.0–36.0)
MCV: 72.7 fL — ABNORMAL LOW (ref 80.0–100.0)
Platelets: 210 10*3/uL (ref 150–400)
RBC: 4.72 MIL/uL (ref 3.87–5.11)
RDW: 16.1 % — ABNORMAL HIGH (ref 11.5–15.5)
WBC: 18.1 10*3/uL — ABNORMAL HIGH (ref 4.0–10.5)
nRBC: 0 % (ref 0.0–0.2)

## 2022-07-29 LAB — BLOOD GAS, VENOUS
Acid-Base Excess: 14.7 mmol/L — ABNORMAL HIGH (ref 0.0–2.0)
Bicarbonate: 42.1 mmol/L — ABNORMAL HIGH (ref 20.0–28.0)
O2 Saturation: 86.4 %
Patient temperature: 37
pCO2, Ven: 68 mmHg — ABNORMAL HIGH (ref 44–60)
pH, Ven: 7.4 (ref 7.25–7.43)
pO2, Ven: 57 mmHg — ABNORMAL HIGH (ref 32–45)

## 2022-07-29 LAB — MAGNESIUM: Magnesium: 2.1 mg/dL (ref 1.7–2.4)

## 2022-07-29 LAB — PHOSPHORUS: Phosphorus: 2.6 mg/dL (ref 2.5–4.6)

## 2022-07-29 MED ORDER — METHYLPREDNISOLONE SODIUM SUCC 40 MG IJ SOLR
20.0000 mg | INTRAMUSCULAR | Status: DC
  Administered 2022-07-30 – 2022-08-01 (×3): 20 mg via INTRAVENOUS
  Filled 2022-07-29 (×3): qty 1

## 2022-07-29 NOTE — Progress Notes (Signed)
Verbal order given to hold oxycodone for today, MD will reassess tomorrow.

## 2022-07-29 NOTE — Progress Notes (Signed)
Sedation cut down to 50%, pt follows simple commands, placed on cpap at 0755. Tolerating wean trial well

## 2022-07-29 NOTE — Progress Notes (Signed)
PHARMACY CONSULT NOTE  Pharmacy Consult for Electrolyte Monitoring and Replacement   Recent Labs: Potassium (mmol/L)  Date Value  07/29/2022 3.7   Magnesium (mg/dL)  Date Value  94/58/5929 2.1   Calcium (mg/dL)  Date Value  24/46/2863 8.8 (L)   Albumin (g/dL)  Date Value  81/77/1165 3.4 (L)   Phosphorus (mg/dL)  Date Value  79/11/8331 2.6   Sodium (mmol/L)  Date Value  07/29/2022 142   Assessment: 61 y/o female with h/o COPD who is admitted with COPD exacerbation, RSV and CAP.   Pt sedated and ventilated. OGT in place. On tube feeds. FWF 32mL q4h  Goal of Therapy:  Electrolytes within normal limits  Plan:  UOP 0.9>0.9 ml/k/h; Net I/O +5.8L; Scr 0.47>0.42>0.38 --All lytes WNL, No electrolyte replacement warranted for today --Follow-up electrolytes with AM labs tomorrow  Martyn Malay 07/29/2022 9:01 AM

## 2022-07-29 NOTE — Progress Notes (Signed)
NAME:  Aimee Hooper, MRN:  [id number], DOB:  [birth year], LOS: 7 ADMISSION DATE:  07/22/2022  CHIEF COMPLAINT:  severe resp distress  BRIEF SYNOPSIS 61 yo obese female with RSV pneumonia, severe COPD exacerbation, superimposed Klebsiella Pneumoniae pneumonia, SEPSIS POA  History of Present Illness:  61 y.o. female history of COPD and asthma not on home O2 per patient report who was diagnosed with RSV yesterday who presents to the emergency department in severe  respiratory distress.  extensive Wheezing, shortness of breath starting this morning.   Severe increased WOB and using accessory muscles to breathe   ER Course EMS personnel who report giving 125 IV Solu-Medrol, IV magnesium, 3 DuoNebs while in route to the emergency department. WBC 28 HBG 13 TROP 11  Placed on BiPAP.  PCCM asked to admit for further workup and treatment.   Please see "significant hospital events" section below for full detailed hospital course.    Pertinent  Medical History  Stage 3 Severe COPD Asthma  Obesity  Emphysema of Lung   Significant Hospital Events: Including procedures, antibiotic start and stop dates in addition to other pertinent events   11/15: Admitted to ICU for severe COPD exacerbation, on BiPAP 11/16: Pt remains on Bipap _0 % requiring precedex gtt and prn ativan due to intermittent anxiety/agitation.  Ultimately required INTUBATION & MECHANICAL VENTILATION 11/17:  Requiring low dose peripheral Levophed due to sedation.  Lung sounds remain diminished with slight wheeze, will rest on vent today with no SBT. 11/18: Slight improvement in air entry bilaterally.  Still with evidence of bronchoconstriction as Peak pressures in mid 30's, exercised in PSV for about 30 minutes, then having episodes of apnea, so placed back on full support. Leukocytosis slowly improving.  Tracheal aspirate still pending 11/19: Increased Peak pressures up to 50, tried in PC but did not help, therefore  placed back in [facility] with reduced TV's. Diuresed, multiple albuterol nebs and Magnesium given.  Tracheal aspirate with Klebsiella, ABX changed to Levaquin. 11/20: Pt with metabolic alkalosis, holding lasix. 92/42: Metabolic alkalosis slowly improving plans for WUA and SBT today  11/22:  WUA and SBT as tolerated.  Micro Data:  Resp Panel by RT-PCR (Flu A&B, Covid) 11/15>>negative  MRSA PCR 11/15>>negative  Blood x2 11/15>> no growth to date Tracheal aspirate 11/16>>Klebsiella Pneumoniae (resistant to Ampicillin)  Antimicrobials:   Antibiotics Given (last 72 hours)     Date/Time Action Medication Dose Rate   07/26/22 0941 New Bag/Given   levofloxacin (LEVAQUIN) IVPB 750 mg 750 mg 100 mL/hr   07/27/22 1111 New Bag/Given   cefTRIAXone (ROCEPHIN) 2 g in sodium chloride 0.9 % 100 mL IVPB 2 g 200 mL/hr   07/28/22 0920 New Bag/Given   cefTRIAXone (ROCEPHIN) 2 g in sodium chloride 0.9 % 100 mL IVPB 2 g 200 mL/hr      Subjective:   No acute events overnight.  Remains on minimal ventilator settings PEEP 5/FiO2 40%  Objective   Blood pressure 109/72, pulse 93, temperature 99.2 F (37.3 C), temperature source Axillary, resp. rate 19, height 5' (1.524 m), weight 94.4 kg, SpO2 100 %.    Vent Mode: PSV;CPAP FiO2 (%):  [40 %] 40 % Set Rate:  [16 bmp] 16 bmp Vt Set:  [400 mL] 400 mL PEEP:  [8 cmH20] 8 cmH20 Pressure Support:  [10 cmH20] 10 cmH20 Plateau Pressure:  [24 cmH20-25 cmH20] 24 cmH20   Intake/Output Summary (Last 24 hours) at 07/29/2022 6834 Last data filed at 07/29/2022 0900 Gross per 24 hour  Intake 3190.19 ml  Output 2125 ml  Net 1065.19 ml    Filed Weights   07/27/22 0342 07/28/22 0429 07/29/22 0436  Weight: 88.2 kg 92.3 kg 94.4 kg    Labs/imaging that I have personally reviewed  (right click and "Reselect all SmartList Selections" daily)   Examination: General: Acute on chronically ill-appearing female , intubated and sedated, NAD  HENT: Atraumatic, normocephalic,  neck supple, no JVD Lungs: Diminished throughout, even, non labored Cardiovascular: Tachycardia, regular rhythm, no m/r/g, 2+ radial/2+ distal pulses, no edema  Abdomen: +BS x4, soft, obese, non distended  Extremities: Normal bulk and tone  Neuro: Sedated, not following commands, PERRL GU: foley in place draining yellow urine  ASSESSMENT AND PLAN  Acute Hypoxic Respiratory Failure in the setting of RSV pneumonia & Klebsiella Pneumoniae Pneumonia, along with severe COPD/Asthma exacerbation - Full vent support, implement lung protective strategies - Plateau pressures less than 30 cm H20 - Wean FiO2 & PEEP as tolerated to maintain O2 sats >92% - Follow intermittent Chest X-ray & ABG as needed - Spontaneous Breathing Trials when respiratory parameters met and mental status permits - Implement VAP Bundle - Bronchodilators & Pulmicort nebs - IV steroids daily  - ABX as above  RSV Pneumonia Superimposed Klebsiella Pneumoniae Pneumonia - Trend WBC and monitor fever curve - Trend PCT - Completed course of Ceftriaxone  Hypotension, suspect related to Sedation +/- Sepsis ~ RESOLVED Echocardiogram 07/27/22: LVEF 60-65%, mild LVH, normal diastolic parameters, RV systolic function normal - Continuous cardiac monitoring - Not requiring vasopressors maintain map >56  Metabolic Alkalosis, suspect Contraction alkalosis in setting of diuresis~IMPROVING  -Monitor I&O's / urinary output -Follow BMP -Ensure adequate renal perfusion -Avoid nephrotoxic agents as able -Replace electrolytes as indicated -Continue to hold diuresis  Mild anemia without obvious signs of bleeding  - Trend CBC - Monitor for s/sx of bleeding and transfuse for hgb <7  Sedation needs in setting of mechanical ventilation - Maintain a RASS goal of 0 to -1 - Precedex gtt and prn fentanyl to maintain RASS goal  - Daily wake up assessment   Pt is critically ill, high risk for decompensation, cardiac arrest, and death.   Given severe COPD, ancipitate pt likely be difficult to liberate from mechanical ventilation and may require tracheostomy.  Best practice (right click and "Reselect all SmartList Selections" daily)  Diet: NPO, tube feeds DVT prophylaxis: LMWH GI prophylaxis: PPI Mobility:  bed rest  Code Status:  FULL Disposition: ICU  11/22: Updated pt's son Randall Hiss at bedside.  Labs   CBC: Recent Labs  Lab 07/25/22 0431 07/26/22 0428 07/27/22 0445 07/28/22 0430 07/29/22 0402  WBC 14.4* 12.7* 13.9* 17.1* 18.1*  HGB 10.8* 10.0* 10.7* 10.4* 10.1*  HCT 36.4 33.9* 36.5 35.1* 34.3*  MCV 72.8* 72.7* 72.6* 73.0* 72.7*  PLT 262 208 219 207 210     Basic Metabolic Panel: Recent Labs  Lab 07/25/22 0431 07/26/22 0428 07/26/22 1939 07/27/22 0445 07/28/22 0430 07/29/22 0402  NA 141 143 143 144 146* 142  K 4.5 4.4 3.5 3.8 3.9 3.7  CL 104 102 89* 91* 98 101  CO2 30 35* >45* >45* 43* 37*  GLUCOSE 165* 127* 106* 128* 119* 120*  BUN _0 27* 30* 26*  CREATININE 0.40* 0.35* 0.49 0.47 0.42* 0.38*  CALCIUM 8.8* 8.5* 8.7* 9.1 8.9 8.8*  MG 2.3 2.3  --  2.4 2.4 2.1  PHOS 3.0 3.5  --  4.3 3.0 2.6    GFR: Estimated Creatinine Clearance: 74.9  mL/min (A) (by C-G formula based on SCr of 0.38 mg/dL (L)). Recent Labs  Lab 07/22/22 1119 07/23/22 0710 07/26/22 0428 07/27/22 0445 07/28/22 0430 07/29/22 0402  WBC  --    < > 12.7* 13.9* 17.1* 18.1*  LATICACIDVEN 1.6  --   --   --   --   --    < > = values in this interval not displayed.     Liver Function Tests: Recent Labs  Lab 07/24/22 0506  AST 36  ALT 44  ALKPHOS 57  BILITOT 0.8  PROT 6.5  ALBUMIN 3.4*    No results for input(s): "LIPASE", "AMYLASE" in the last 168 hours. No results for input(s): "AMMONIA" in the last 168 hours.  ABG    Component Value Date/Time   PHART 7.45 07/28/2022 0835   PCO2ART 71 (HH) 07/28/2022 0835   PO2ART 96 07/28/2022 0835   HCO3 49.3 (H) 07/28/2022 0835   O2SAT 99.1 07/28/2022 0835      Coagulation Profile: No results for input(s): "INR", "PROTIME" in the last 168 hours.  Cardiac Enzymes: No results for input(s): "CKTOTAL", "CKMB", "CKMBINDEX", "TROPONINI" in the last 168 hours.  HbA1C: No results found for: "HGBA1C"  CBG: Recent Labs  Lab 07/28/22 1623 07/28/22 2024 07/28/22 2344 07/29/22 0410 07/29/22 0755  GLUCAP 148* 131* 128* 127* 133*    Review of Symptoms:  Unable to assess, pt in intubated/sedated/critically ill  Past Medical History:  She,  has a past medical history of Asthma, Emphysema lung ([place]), Obesity, and Stage 3 severe COPD by GOLD classification ([place]).   Surgical History:  History reviewed. No pertinent surgical history.   Social History:   reports that she has been smoking cigarettes. She has never used smokeless tobacco. She reports that she does not drink alcohol and does not use drugs.   Family History:  Her family history is not on file.   Allergies No Known Allergies   Home Medications  Prior to Admission medications   Medication Sig Start Date End Date Taking? Authorizing Provider  azithromycin (ZITHROMAX) 250 MG tablet Take 250 mg by mouth daily. 08/27/20  Yes [provider]  celecoxib (CELEBREX) 200 MG capsule Take 200 mg by mouth daily. Patient not taking: Reported on 07/22/2022 06/26/20   [provider]  DIFFERIN 0.1 % cream Apply 1 application topically at bedtime. Patient not taking: Reported on 07/22/2022 06/26/20   [provider]  loratadine (CLARITIN) 10 MG tablet Take 10 mg by mouth daily. Patient not taking: Reported on 07/22/2022 06/26/20   [provider]  nicotine (NICODERM CQ - DOSED IN MG/24 HOURS) 21 mg/24hr patch Place 1 patch onto the skin daily. Patient not taking: Reported on 07/22/2022 07/24/20   [provider]  TRELEGY ELLIPTA 100-62.5-25 MCG/INH AEPB Take 1 puff by mouth daily. Patient not taking: Reported on 07/22/2022 08/06/20   [provider]       Critical Care Time: 40 minutes    Darel Hong, AGACNP-BC [facility] [place] epic messenger for cross cover needs If after hours, please call E-link

## 2022-07-29 NOTE — TOC Progression Note (Signed)
Transition of Care [facility]) - Progression Note    Patient Details  Name: Aimee Hooper MRN: [id number] Date of Birth: [birth year]  [facility] of [facility]) CM/SW Contact  Allayne Butcher, RN Phone Number: 07/29/2022, 12:38 PM  Clinical Narrative:    Patient remains in the ICU intubated and sedated.  TOC continues to follow for needs.    Expected Discharge Plan: Home/Self Care Barriers to Discharge: Continued Medical Work up  Expected Discharge Plan and Services Expected Discharge Plan: Home/Self Care       Living arrangements for the past 2 months: Apartment                                       Social Determinants of Health (SDOH) Interventions    Readmission Risk Interventions     No data to display

## 2022-07-30 ENCOUNTER — Inpatient Hospital Stay: Payer: Medicaid Other

## 2022-07-30 DIAGNOSIS — J441 Chronic obstructive pulmonary disease with (acute) exacerbation: Secondary | ICD-10-CM | POA: Diagnosis not present

## 2022-07-30 DIAGNOSIS — E873 Alkalosis: Secondary | ICD-10-CM | POA: Diagnosis not present

## 2022-07-30 DIAGNOSIS — J9621 Acute and chronic respiratory failure with hypoxia: Secondary | ICD-10-CM | POA: Diagnosis not present

## 2022-07-30 DIAGNOSIS — J189 Pneumonia, unspecified organism: Secondary | ICD-10-CM | POA: Diagnosis not present

## 2022-07-30 LAB — BASIC METABOLIC PANEL
Anion gap: 4 — ABNORMAL LOW (ref 5–15)
BUN: 24 mg/dL — ABNORMAL HIGH (ref 8–23)
CO2: 33 mmol/L — ABNORMAL HIGH (ref 22–32)
Calcium: 8.9 mg/dL (ref 8.9–10.3)
Chloride: 105 mmol/L (ref 98–111)
Creatinine, Ser: 0.36 mg/dL — ABNORMAL LOW (ref 0.44–1.00)
GFR, Estimated: >60 mL/min (ref >60)
Glucose, Bld: 143 mg/dL — ABNORMAL HIGH (ref 70–99)
Potassium: 3.6 mmol/L (ref 3.5–5.1)
Sodium: 142 mmol/L (ref 135–145)

## 2022-07-30 LAB — CBC
HCT: 32.6 % — ABNORMAL LOW (ref 36.0–46.0)
Hemoglobin: 9.7 g/dL — ABNORMAL LOW (ref 12.0–15.0)
MCH: 21.5 pg — ABNORMAL LOW (ref 26.0–34.0)
MCHC: 29.8 g/dL — ABNORMAL LOW (ref 30.0–36.0)
MCV: 72.1 fL — ABNORMAL LOW (ref 80.0–100.0)
Platelets: 210 10*3/uL (ref 150–400)
RBC: 4.52 MIL/uL (ref 3.87–5.11)
RDW: 16.1 % — ABNORMAL HIGH (ref 11.5–15.5)
WBC: 18.2 10*3/uL — ABNORMAL HIGH (ref 4.0–10.5)
nRBC: 0 % (ref 0.0–0.2)

## 2022-07-30 LAB — GLUCOSE, CAPILLARY
Glucose-Capillary: 131 mg/dL — ABNORMAL HIGH (ref 70–99)
Glucose-Capillary: 127 mg/dL — ABNORMAL HIGH (ref 70–99)
Glucose-Capillary: 124 mg/dL — ABNORMAL HIGH (ref 70–99)
Glucose-Capillary: 106 mg/dL — ABNORMAL HIGH (ref 70–99)
Glucose-Capillary: 110 mg/dL — ABNORMAL HIGH (ref 70–99)
Glucose-Capillary: 90 mg/dL (ref 70–99)

## 2022-07-30 LAB — PHOSPHORUS: Phosphorus: 2.6 mg/dL (ref 2.5–4.6)

## 2022-07-30 LAB — MRSA NEXT GEN BY PCR, NASAL: MRSA by PCR Next Gen: NOT DETECTED

## 2022-07-30 LAB — MAGNESIUM: Magnesium: 2 mg/dL (ref 1.7–2.4)

## 2022-07-30 MED ORDER — ORAL CARE MOUTH RINSE: 15.0000 mL | OROMUCOSAL | Status: DC | PRN

## 2022-07-30 MED ORDER — PIPERACILLIN-TAZOBACTAM 3.375 G IVPB
3.3750 g | Freq: Three times a day (TID) | INTRAVENOUS | Status: DC
  Administered 2022-07-30 – 2022-08-02 (×9): 3.375 g via INTRAVENOUS
  Filled 2022-07-30 (×9): qty 50

## 2022-07-30 MED ORDER — ACETAMINOPHEN 10 MG/ML IV SOLN
1000.0000 mg | Freq: Four times a day (QID) | INTRAVENOUS | Status: AC | PRN
  Administered 2022-07-30 (×2): 1000 mg via INTRAVENOUS
  Filled 2022-07-30 (×3): qty 100

## 2022-07-30 MED ORDER — ORAL CARE MOUTH RINSE: 15.0000 mL | OROMUCOSAL | Status: DC

## 2022-07-30 NOTE — Progress Notes (Signed)
Pt with periods of agitation, gagging on ET tube and coughing her NGT out. Vent alarming with low tidal volumes during these episodes. Fentanyl given with good results. Precedex infusing.

## 2022-07-30 NOTE — Progress Notes (Signed)
NAME:  Aimee Hooper, MRN:  [id number], DOB:  [birth year], LOS: 8 ADMISSION DATE:  07/22/2022, CHIEF COMPLAINT:  shortness of breath   Brief History:  61 yo obese female with RSV pneumonia, severe COPD exacerbation, superimposed Klebsiella Pneumoniae pneumonia, SEPSIS POA   History of Present Illness:  61 y.o. female history of COPD and asthma not on home O2 per patient report who was diagnosed with RSV prior to presenation and presents to the emergency department in severe  respiratory distress. Severe increased WOB and using accessory muscles to breath, failed BiPAP and admitted to the ICU and intubated. Now managed for COPD exacerbation and HAP.   Please see "significant hospital events" section below for full detailed hospital course.  Pertinent  Medical History  Stage 3 Severe COPD Asthma  Obesity  Emphysema of Lung   Significant Hospital Events: Including procedures, antibiotic start and stop dates in addition to other pertinent events   11/15: Admitted to ICU for severe COPD exacerbation, on BiPAP 11/16: Pt remains on Bipap @40 % requiring precedex gtt and prn ativan due to intermittent anxiety/agitation.  Ultimately required INTUBATION & MECHANICAL VENTILATION 11/17:  Requiring low dose peripheral Levophed due to sedation.  Lung sounds remain diminished with slight wheeze, will rest on vent today with no SBT. 11/18: Slight improvement in air entry bilaterally.  Still with evidence of bronchoconstriction as Peak pressures in mid 30's, exercised in PSV for about 30 minutes, then having episodes of apnea, so placed back on full support. Leukocytosis slowly improving.  Tracheal aspirate still pending 11/19: Increased Peak pressures up to 50, tried in PC but did not help, therefore placed back in [facility] with reduced TV's. Diuresed, multiple albuterol nebs and Magnesium given.  Tracheal aspirate with Klebsiella, ABX changed to Levaquin. 11/20: Pt with metabolic alkalosis, holding  lasix. 11/21: Metabolic alkalosis slowly improving plans for WUA and SBT today  11/22:  WUA and SBT as tolerated.  Interim History / Subjective:  Follows commands this morning. Family updated at the bedside  Objective   Blood pressure (!) 146/98, pulse 84, temperature 99.5 F (37.5 C), resp. rate (!) 21, height 5' (1.524 m), weight 88.1 kg, SpO2 100 %.    Vent Mode: PRVC FiO2 (%):  [30 %-40 %] 30 % Set Rate:  [16 bmp] 16 bmp Vt Set:  [400 mL] 400 mL PEEP:  [8 cmH20] 8 cmH20 Pressure Support:  [5 cmH20-10 cmH20] 5 cmH20 Plateau Pressure:  [15 cmH20-17 cmH20] 17 cmH20   Intake/Output Summary (Last 24 hours) at 07/30/2022 0738 Last data filed at 07/30/2022 0600 Gross per 24 hour  Intake 2033.81 ml  Output 2235 ml  Net -201.19 ml   Filed Weights   07/28/22 0429 07/29/22 0436 07/30/22 0453  Weight: 92.3 kg 94.4 kg 88.1 kg    Examination: Physical Exam Constitutional:      General: She is not in acute distress.    Appearance: She is obese. She is ill-appearing.  HENT:     Mouth/Throat:     Mouth: Mucous membranes are moist.  Eyes:     Extraocular Movements: Extraocular movements intact.     Pupils: Pupils are equal, round, and reactive to light.  Cardiovascular:     Rate and Rhythm: Normal rate and regular rhythm.     Heart sounds: Normal heart sounds.  Pulmonary:     Breath sounds: Rales present.     Comments: Ventilated breath sounds anteriorly Abdominal:     General: There is no distension.  Palpations: Abdomen is soft.  Musculoskeletal:        General: Normal range of motion.  Skin:    General: Skin is warm.  Neurological:     Mental Status: She is alert.     Comments: Follows commands, unable to assess further given sedation and ETT      Assessment and Plan    61 year old female with a history of Asthma COPD overlap followed by pulmonary at [facility] who presents to the hospital with respiratory failure requiring intubation and mechanical ventilation.    Neurology #Sedation   Difficult to sedate initially, requiring propofol and ketamine and plenty of adjuncts and boluses. Now transitioned to dexmedetomidine given report of chronic marijuana use suggesting propofol resistance. She's had an excellent response to dexmedetomidine allowing for weaning of sedation. Continue with PRN Fentanyl for pain control.  -daily sedation holiday   Pulmonary #COPD-Asthma Overlap #Recent RSV pneumonia #COPD exacerbation #Acute Hypoxic and Hypercapnic Respiratory Failure   History of Asthma COPD overlap followed by outpatient pulmonologist and is maintained on Breztri, Montelukast, Azithromycin, and PRN Symbicort. She's had multiple exacerbations of her COPD/Asthma (as recently as 06/30/22, 07/05/22, and 07/22/22) and presents now with same. EMS activated and noted her in respiratory extremis, failed BiPAP in the ED and required intubation on 07/24/2022. She is chronically hypercapnic and her blood gas shows respiratory acidosis and metabolic alkalosis (diuresis resulting in contraction alkalosis). Respiratory cultures have also grown Klebsiella pneumonia.   Will adjust ventilator settings and titrate for a pH of 7.4 and allow hypercapnia while we allow her acid base status to reach equilibrium. Peak pressures had been significantly elevated with no elevation in plateaus suggesting bronchospasm but this has resolved. We did increase PEEP given abdominal obesity. Did trial her on SBT with minimal support yesterday and she tolerated well. Will repeat SBT today and assess for extubation. Did discuss with her son that she's high risk for re-intubation, and if that were to happen, she would require a tracheostomy tube.   -monitor Peak and plateau pressures -standing duo-nebs -decrease methylpred to 20 mg daily, initiate taper -treat pneumonia as below -High risk extubation, extubate to BiPAP once able   Cardiovascular   Hypotension in the setting of sedation, now  improved. TTE with LV hypertrophy but otherwise no acute findings   GI Continue tube feeds and PPI for prophylaxis   Renal #Metabolic Alkalosis  She is chronically hypercapnic with metabolic compensation. She did get diuresed which likely resulted in contraction alkalosis and significant alkalemia. Holding diuresis. Long term I believe she will require nocturnal NIV.   Hem/Onc   Lovenox for prophylaxis   ID #Klebsiella Pneumonia   Recent RSV infection complicated by Klebsiella superimposed infection. Continue Ceftriaxone given organism is sensitive. RSV was over two weeks ago and unlikely active at the moment. Continue COPD treatment as above.   Best Practice (right click and "Reselect all SmartList Selections" daily)   Diet/type: tubefeeds DVT prophylaxis: LMWH GI prophylaxis: PPI Lines: N/A Foley:  Yes, and it is still needed Code Status:  full code Last date of multidisciplinary goals of care discussion [07/30/2022]  Labs   CBC: Recent Labs  Lab 07/26/22 0428 07/27/22 0445 07/28/22 0430 07/29/22 0402 07/30/22 0443  WBC 12.7* 13.9* 17.1* 18.1* 18.2*  HGB 10.0* 10.7* 10.4* 10.1* 9.7*  HCT 33.9* 36.5 35.1* 34.3* 32.6*  MCV 72.7* 72.6* 73.0* 72.7* 72.1*  PLT 208 219 207 210 210    Basic Metabolic Panel: Recent Labs  Lab 07/26/22  1194 07/26/22 1939 07/27/22 0445 07/28/22 0430 07/29/22 0402 07/30/22 0443  NA 143 143 144 146* 142 142  K 4.4 3.5 3.8 3.9 3.7 3.6  CL 102 89* 91* 98 101 105  CO2 35* >45* >45* 43* 37* 33*  GLUCOSE 127* 106* 128* 119* 120* 143*  BUN 18 21 27* 30* 26* 24*  CREATININE 0.35* 0.49 0.47 0.42* 0.38* 0.36*  CALCIUM 8.5* 8.7* 9.1 8.9 8.8* 8.9  MG 2.3  --  2.4 2.4 2.1 2.0  PHOS 3.5  --  4.3 3.0 2.6 2.6   GFR: Estimated Creatinine Clearance: 71.9 mL/min (A) (by C-G formula based on SCr of 0.36 mg/dL (L)). Recent Labs  Lab 07/27/22 0445 07/28/22 0430 07/29/22 0402 07/30/22 0443  WBC 13.9* 17.1* 18.1* 18.2*    Liver Function  Tests: Recent Labs  Lab 07/24/22 0506  AST 36  ALT 44  ALKPHOS 57  BILITOT 0.8  PROT 6.5  ALBUMIN 3.4*   No results for input(s): "LIPASE", "AMYLASE" in the last 168 hours. No results for input(s): "AMMONIA" in the last 168 hours.  ABG    Component Value Date/Time   PHART 7.45 07/28/2022 0835   PCO2ART 71 (HH) 07/28/2022 0835   PO2ART 96 07/28/2022 0835   HCO3 42.1 (H) 07/29/2022 1102   O2SAT 86.4 07/29/2022 1102     Coagulation Profile: No results for input(s): "INR", "PROTIME" in the last 168 hours.  Cardiac Enzymes: No results for input(s): "CKTOTAL", "CKMB", "CKMBINDEX", "TROPONINI" in the last 168 hours.  HbA1C: No results found for: "HGBA1C"  CBG: Recent Labs  Lab 07/29/22 1618 07/29/22 1947 07/29/22 2337 07/30/22 0400 07/30/22 0735  GLUCAP 134* 123* 138* 131* 127*   Past Medical History:  She,  has a past medical history of Asthma, Emphysema lung (HCC), Obesity, and Stage 3 severe COPD by GOLD classification (HCC).   Surgical History:  History reviewed. No pertinent surgical history.   Social History:   reports that she has been smoking cigarettes. She has never used smokeless tobacco. She reports that she does not drink alcohol and does not use drugs.   Family History:  Her family history is not on file.   Allergies No Known Allergies   Home Medications  Prior to Admission medications   Medication Sig Start Date End Date Taking? Authorizing Provider  albuterol (VENTOLIN HFA) 108 (90 Base) MCG/ACT inhaler Inhale 2 puffs into the lungs every 6 (six) hours as needed for wheezing. 03/03/22 03/03/23 Yes [provider]  azithromycin (ZITHROMAX) 250 MG tablet Take 250 mg by mouth daily. 08/27/20  Yes [provider]  BREZTRI AEROSPHERE 160-9-4.8 MCG/ACT AERO Inhale 2 puffs into the lungs 2 (two) times daily. 09/25/21  Yes [provider]  budesonide-formoterol (SYMBICORT) 80-4.5 MCG/ACT inhaler Inhale 2 puffs into the lungs 2  (two) times daily. 12/17/21 12/17/22 Yes [provider]  ipratropium (ATROVENT) 0.02 % nebulizer solution Take 0.5 mg by nebulization every 6 (six) hours as needed for wheezing.   Yes [provider]  montelukast (SINGULAIR) 10 MG tablet Take 10 mg by mouth daily. 05/05/22 05/05/23 Yes [provider]     Critical care time: 57 minutes

## 2022-07-30 NOTE — Progress Notes (Signed)
PHARMACY CONSULT NOTE  Pharmacy Consult for Electrolyte Monitoring and Replacement   Recent Labs: Potassium (mmol/L)  Date Value  07/30/2022 3.6   Magnesium (mg/dL)  Date Value  38/46/6599 2.0   Calcium (mg/dL)  Date Value  35/70/1779 8.9   Albumin (g/dL)  Date Value  39/11/90 3.4 (L)   Phosphorus (mg/dL)  Date Value  33/00/7622 2.6   Sodium (mmol/L)  Date Value  07/30/2022 142   Assessment: 61 y/o female with h/o COPD who is admitted with COPD exacerbation, RSV and CAP.   Pt sedated and ventilated. OGT in place. On tube feeds. FWF 67mL q4h  Goal of Therapy:  Electrolytes within normal limits  Plan:  --All electrolytes WNL, No electrolyte replacement warranted for today --Follow-up electrolytes with AM labs tomorrow  Lowella Bandy 07/30/2022 7:31 AM

## 2022-07-30 NOTE — Progress Notes (Signed)
Patient extubated to Bipa per MD request. PT tolerating well at this time (see Bipap note)

## 2022-07-31 DIAGNOSIS — J189 Pneumonia, unspecified organism: Secondary | ICD-10-CM | POA: Diagnosis not present

## 2022-07-31 DIAGNOSIS — J9621 Acute and chronic respiratory failure with hypoxia: Secondary | ICD-10-CM | POA: Diagnosis not present

## 2022-07-31 DIAGNOSIS — J441 Chronic obstructive pulmonary disease with (acute) exacerbation: Secondary | ICD-10-CM | POA: Diagnosis not present

## 2022-07-31 DIAGNOSIS — E873 Alkalosis: Secondary | ICD-10-CM | POA: Diagnosis not present

## 2022-07-31 LAB — BASIC METABOLIC PANEL
Anion gap: 6 (ref 5–15)
BUN: 19 mg/dL (ref 8–23)
CO2: 31 mmol/L (ref 22–32)
Calcium: 8.7 mg/dL — ABNORMAL LOW (ref 8.9–10.3)
Chloride: 107 mmol/L (ref 98–111)
Creatinine, Ser: 0.32 mg/dL — ABNORMAL LOW (ref 0.44–1.00)
GFR, Estimated: >60 mL/min (ref >60)
Glucose, Bld: 92 mg/dL (ref 70–99)
Potassium: 3.3 mmol/L — ABNORMAL LOW (ref 3.5–5.1)
Sodium: 144 mmol/L (ref 135–145)

## 2022-07-31 LAB — CBC
HCT: 28.5 % — ABNORMAL LOW (ref 36.0–46.0)
Hemoglobin: 8.6 g/dL — ABNORMAL LOW (ref 12.0–15.0)
MCH: 21.6 pg — ABNORMAL LOW (ref 26.0–34.0)
MCHC: 30.2 g/dL (ref 30.0–36.0)
MCV: 71.4 fL — ABNORMAL LOW (ref 80.0–100.0)
Platelets: 181 10*3/uL (ref 150–400)
RBC: 3.99 MIL/uL (ref 3.87–5.11)
RDW: 15.8 % — ABNORMAL HIGH (ref 11.5–15.5)
WBC: 14.6 10*3/uL — ABNORMAL HIGH (ref 4.0–10.5)
nRBC: 0 % (ref 0.0–0.2)

## 2022-07-31 LAB — GLUCOSE, CAPILLARY
Glucose-Capillary: 102 mg/dL — ABNORMAL HIGH (ref 70–99)
Glucose-Capillary: 102 mg/dL — ABNORMAL HIGH (ref 70–99)
Glucose-Capillary: 91 mg/dL (ref 70–99)
Glucose-Capillary: 95 mg/dL (ref 70–99)
Glucose-Capillary: 90 mg/dL (ref 70–99)
Glucose-Capillary: 85 mg/dL (ref 70–99)

## 2022-07-31 MED ORDER — ACETAMINOPHEN 10 MG/ML IV SOLN
1000.0000 mg | Freq: Four times a day (QID) | INTRAVENOUS | Status: AC | PRN
  Administered 2022-07-31: 1000 mg via INTRAVENOUS

## 2022-07-31 MED ORDER — IPRATROPIUM-ALBUTEROL 0.5-2.5 (3) MG/3ML IN SOLN: 3.0000 mL | Freq: Four times a day (QID) | RESPIRATORY_TRACT | Status: DC

## 2022-07-31 MED ORDER — MELATONIN 5 MG PO TABS
5.0000 mg | ORAL_TABLET | Freq: Every evening | ORAL | Status: DC | PRN
  Administered 2022-07-31: 5 mg via ORAL
  Filled 2022-07-31: qty 1

## 2022-07-31 MED ORDER — IPRATROPIUM-ALBUTEROL 0.5-2.5 (3) MG/3ML IN SOLN
3.0000 mL | Freq: Four times a day (QID) | RESPIRATORY_TRACT | Status: DC
  Administered 2022-07-31 – 2022-08-02 (×6): 3 mL via RESPIRATORY_TRACT
  Filled 2022-07-31 (×8): qty 3

## 2022-07-31 MED ORDER — POTASSIUM CHLORIDE 10 MEQ/100ML IV SOLN
10.0000 meq | INTRAVENOUS | Status: AC
  Administered 2022-07-31 (×4): 10 meq via INTRAVENOUS
  Filled 2022-07-31 (×4): qty 100

## 2022-07-31 NOTE — TOC Progression Note (Signed)
Transition of Care [facility]) - Progression Note    Patient Details  Name: Aimee Hooper MRN: [id number] Date of Birth: [birth year]  [facility] of [facility] CM/SW Contact  Shelbie Hutching, RN Phone Number: 07/31/2022, 2:43 PM  Clinical Narrative:    Patient extubated yesterday and tolerating [place] 3L.  Patient is on bipap here in hospital at night.  RNCM met with patient at the bedside.  She reports [place].  Patient wears oxygen 2.5 L chronic, she cannot remember the name of the oxygen company she uses.  When asked what her goal was the patient said to walk out of here.  She seems a little restless and is asking if she can get a breathing treatment.    Informed patient that I would let the bedside nurse know that she was asking about breathing treatment.   TOC will cont to follow.    Expected Discharge Plan: Home/Self Care Barriers to Discharge: Continued Medical Work up  Expected Discharge Plan and Services Expected Discharge Plan: Home/Self Care   Discharge Planning Services: CM Consult   Living arrangements for the past 2 months: Apartment                                       Social Determinants of Health (SDOH) Interventions    Readmission Risk Interventions     No data to display

## 2022-07-31 NOTE — Progress Notes (Signed)
Nutrition Follow-up  DOCUMENTATION CODES:   Obesity unspecified  INTERVENTION:   -D/c Vital AF 1.2 due to no feeding access -RD will follow for diet advancement and add supplements as appropriate -If pt unable to pass swallow eval, consider enteral access placement to start TF:   Initiate Osmolite 1.2 @ 20 ml/hr and increase by 10 ml every 4 hours to goal rate of 60 ml/hr.   105 ml free water flush every 6 hours  Tube feeding regimen provides 1728 kcal (100% of needs), 80 grams of protein, and 1181 ml of H2O. Total free water: 1601 ml daily  NUTRITION DIAGNOSIS:   Inadequate oral intake related to inability to eat (pt sedated and ventilated) as evidenced by NPO status.  Ongoing  GOAL:   Patient will meet greater than or equal to 90% of their needs  Unmet  MONITOR:   Diet advancement  REASON FOR ASSESSMENT:   Ventilator    ASSESSMENT:   61 y/o female with h/o COPD, chronic pain and marijuana use who is admitted with COPD exacerbation, RSV and CAP.  11/23- extubated  Reviewed I/O's: -1.6 L x 24 hours and +3.8 L since admission  UOP: 2.2 L x 24 hours   Case discussed with RN, MD, and during ICU rounds. Pt extubated yesterday and requiring Bi-pap at night. Pt is off sedation and awake, alert, and oriented. Plan to taper solu-medrol today. Per MD, pt is at high risk for decompensation. Plan for PT and SLP eval today. Pt no longer with feeding access.   Medications reviewed and include solu-medrol.   Labs reviewed: K: 3.3, CBGS: 102 (inpatient orders for glycemic control are none).    Diet Order:   Diet Order             Diet NPO time specified  Diet effective now                   EDUCATION NEEDS:   No education needs have been identified at this time  Skin:  Skin Assessment: Reviewed RN Assessment  Last BM:  07/29/22  Height:   Ht Readings from Last 1 Encounters:  07/22/22 5' (1.524 m)    Weight:   Wt Readings from Last 1 Encounters:   07/31/22 82.7 kg    Ideal Body Weight:  45.4 kg  BMI:  Body mass index is 35.61 kg/m.  Estimated Nutritional Needs:   Kcal:  1600-1800  Protein:  75-90 grams  Fluid:  > 1.6 L    Levada Schilling, RD, LDN, CDCES Registered Dietitian II Certified Diabetes Care and Education Specialist Please refer to [facility] for RD and/or RD on-call/weekend/after hours pager

## 2022-07-31 NOTE — Progress Notes (Signed)
PHARMACY CONSULT NOTE  Pharmacy Consult for Electrolyte Monitoring and Replacement   Recent Labs: Potassium (mmol/L)  Date Value  07/31/2022 3.3 (L)   Magnesium (mg/dL)  Date Value  35/70/1779 2.0   Calcium (mg/dL)  Date Value  39/11/90 8.7 (L)   Albumin (g/dL)  Date Value  33/00/7622 3.4 (L)   Phosphorus (mg/dL)  Date Value  63/33/5456 2.6   Sodium (mmol/L)  Date Value  07/31/2022 144   Assessment: 61 y/o female with h/o COPD who is admitted with COPD exacerbation, RSV and CAP.   Pt sedated and ventilated. OGT in place. On tube feeds. FWF 44mL q4h  Goal of Therapy:  Electrolytes within normal limits  Plan:  K 3.6>3.3:  Will replete with KCL IV q1h x4doses All other electrolytes WNL, No electrolyte replacement warranted for today Follow-up electrolytes with AM labs tomorrow  Martyn Malay 07/31/2022 8:47 AM

## 2022-07-31 NOTE — Progress Notes (Signed)
NAME:  Aimee Hooper, MRN:  [id number], DOB:  Aug 15, 1961, LOS: 9 ADMISSION DATE:  07/22/2022, CHIEF COMPLAINT:  shortness of breath   Brief History:  61 yo obese female with RSV pneumonia, severe COPD exacerbation, superimposed Klebsiella Pneumoniae pneumonia, SEPSIS POA   History of Present Illness:  61 y.o. female history of COPD and asthma not on home O2 per patient report who was diagnosed with RSV prior to presenation and presents to the emergency department in severe  respiratory distress. Severe increased WOB and using accessory muscles to breath, failed BiPAP and admitted to the ICU and intubated. Now managed for COPD exacerbation and HAP.   Please see "significant hospital events" section below for full detailed hospital course.  Pertinent  Medical History  Stage 3 Severe COPD Asthma  Obesity  Emphysema of Lung   Significant Hospital Events: Including procedures, antibiotic start and stop dates in addition to other pertinent events   11/15: Admitted to ICU for severe COPD exacerbation, on BiPAP 11/16: Pt remains on Bipap @40 % requiring precedex gtt and prn ativan due to intermittent anxiety/agitation.  Ultimately required INTUBATION & MECHANICAL VENTILATION 11/17:  Requiring low dose peripheral Levophed due to sedation.  Lung sounds remain diminished with slight wheeze, will rest on vent today with no SBT. 11/18: Slight improvement in air entry bilaterally.  Still with evidence of bronchoconstriction as Peak pressures in mid 30's, exercised in PSV for about 30 minutes, then having episodes of apnea, so placed back on full support. Leukocytosis slowly improving.  Tracheal aspirate still pending 11/19: Increased Peak pressures up to 50, tried in PC but did not help, therefore placed back in [facility] with reduced TV's. Diuresed, multiple albuterol nebs and Magnesium given.  Tracheal aspirate with Klebsiella, ABX changed to Levaquin. 11/20: Pt with metabolic alkalosis, holding  lasix. 11/21: Metabolic alkalosis slowly improving plans for WUA and SBT today  11/22:  WUA and SBT as tolerated. 11/23: extubated to BiPAP  Interim History / Subjective:  Follows commands this morning. Family updated at the bedside  Objective   Blood pressure (!) 142/64, pulse 76, temperature 97.7 F (36.5 C), temperature source Oral, resp. rate 18, height 5' (1.524 m), weight 82.7 kg, SpO2 95 %.    Vent Mode: PSV;Spontaneous FiO2 (%):  [30 %] 30 % PEEP:  [5 cmH20] 5 cmH20 Pressure Support:  [5 cmH20] 5 cmH20   Intake/Output Summary (Last 24 hours) at 07/31/2022 0809 Last data filed at 07/31/2022 0600 Gross per 24 hour  Intake 344.27 ml  Output 2025 ml  Net -1680.73 ml    Filed Weights   07/29/22 0436 07/30/22 0453 07/31/22 0456  Weight: 94.4 kg 88.1 kg 82.7 kg    Examination: Physical Exam Constitutional:      General: She is not in acute distress.    Appearance: She is obese. She is ill-appearing.  HENT:     Mouth/Throat:     Mouth: Mucous membranes are moist.  Eyes:     Extraocular Movements: Extraocular movements intact.     Pupils: Pupils are equal, round, and reactive to light.  Cardiovascular:     Rate and Rhythm: Normal rate and regular rhythm.     Heart sounds: Normal heart sounds.  Pulmonary:     Breath sounds: Rales present.     Comments: Ventilated breath sounds anteriorly Abdominal:     General: There is no distension.     Palpations: Abdomen is soft.  Musculoskeletal:        General: Normal  range of motion.  Skin:    General: Skin is warm.  Neurological:     General: No focal deficit present.     Mental Status: She is alert and oriented to person, place, and time.     Comments: Follows commands, awake, alert and oriented       Assessment and Plan    61 year old female with a history of Asthma COPD overlap followed by pulmonary at [facility] who presents to the hospital with respiratory failure requiring intubation and mechanical  ventilation.  Neurology   Difficult to sedate initially, requiring propofol and ketamine and plenty of adjuncts and boluses. Eventually transitioned to dexmedetomidine and successfully extubated yesterday.  -IV Acetaminophen for pain control   Pulmonary #COPD-Asthma Overlap #Recent RSV pneumonia #COPD exacerbation #Acute Hypoxic and Hypercapnic Respiratory Failure   History of Asthma COPD overlap followed by outpatient pulmonologist and is maintained on Breztri, Montelukast, Azithromycin, and PRN Symbicort. She's had multiple exacerbations of her COPD/Asthma (as recently as 06/30/22, 07/05/22, and 07/22/22) and presents with same. EMS activated and noted her in respiratory extremis, failed BiPAP in the ED and required intubation on 07/24/2022. She is chronically hypercapnic and her blood gas shows respiratory acidosis and metabolic alkalosis (diuresis resulting in contraction alkalosis). Respiratory cultures have also grown Klebsiella pneumonia. She was extubated 07/30/2022 to BiPAP. She is improved today. She will require nocturnal bipap given hypercapnic respiratory failure.  -standing duo-nebs every 6 hours and q4hours PRN -decreased methylpred to 20 mg daily, initiate prolonged taper -treat pneumonia as below -Nocturnal BiPAP   Cardiovascular  Hypotension in the setting of sedation, now improved. TTE with LV hypertrophy but otherwise no acute findings   GI Pending SLP. D/c prophylaxis   Renal #Metabolic Alkalosis  She is chronically hypercapnic with metabolic compensation. She did get diuresed which likely resulted in contraction alkalosis and significant alkalemia.   Hem/Onc   Lovenox for prophylaxis   ID #Klebsiella Pneumonia   Recent RSV infection complicated by Klebsiella superimposed infection. Received Ceftriaxone given organism is sensitive. RSV was over two weeks ago and unlikely active at the moment. Zosyn restarted given increased secretions and fever prior to  extubation, will continue for today and re-assess for discontinuation tomorrow.   Best Practice (right click and "Reselect all SmartList Selections" daily)   Diet/type: NPO DVT prophylaxis: LMWH GI prophylaxis: N/A Lines: N/A Foley:  Yes, and it is still needed Code Status:  full code Last date of multidisciplinary goals of care discussion [07/31/2022]  Labs   CBC: Recent Labs  Lab 07/27/22 0445 07/28/22 0430 07/29/22 0402 07/30/22 0443 07/31/22 0432  WBC 13.9* 17.1* 18.1* 18.2* 14.6*  HGB 10.7* 10.4* 10.1* 9.7* 8.6*  HCT 36.5 35.1* 34.3* 32.6* 28.5*  MCV 72.6* 73.0* 72.7* 72.1* 71.4*  PLT 219 207 210 210 181     Basic Metabolic Panel: Recent Labs  Lab 07/26/22 0428 07/26/22 1939 07/27/22 0445 07/28/22 0430 07/29/22 0402 07/30/22 0443 07/31/22 0432  NA 143   < > 144 146* 142 142 144  K 4.4   < > 3.8 3.9 3.7 3.6 3.3*  CL 102   < > 91* 98 101 105 107  CO2 35*   < > >45* 43* 37* 33* 31  GLUCOSE 127*   < > 128* 119* 120* 143* 92  BUN 18   < > 27* 30* 26* 24* 19  CREATININE 0.35*   < > 0.47 0.42* 0.38* 0.36* 0.32*  CALCIUM 8.5*   < > 9.1 8.9 8.8*  8.9 8.7*  MG 2.3  --  2.4 2.4 2.1 2.0  --   PHOS 3.5  --  4.3 3.0 2.6 2.6  --    < > = values in this interval not displayed.    GFR: Estimated Creatinine Clearance: 69.5 mL/min (A) (by C-G formula based on SCr of 0.32 mg/dL (L)). Recent Labs  Lab 07/28/22 0430 07/29/22 0402 07/30/22 0443 07/31/22 0432  WBC 17.1* 18.1* 18.2* 14.6*     Liver Function Tests: No results for input(s): "AST", "ALT", "ALKPHOS", "BILITOT", "PROT", "ALBUMIN" in the last 168 hours.  No results for input(s): "LIPASE", "AMYLASE" in the last 168 hours. No results for input(s): "AMMONIA" in the last 168 hours.  ABG    Component Value Date/Time   PHART 7.45 07/28/2022 0835   PCO2ART 71 (HH) 07/28/2022 0835   PO2ART 96 07/28/2022 0835   HCO3 42.1 (H) 07/29/2022 1102   O2SAT 86.4 07/29/2022 1102     Coagulation Profile: No results  for input(s): "INR", "PROTIME" in the last 168 hours.  Cardiac Enzymes: No results for input(s): "CKTOTAL", "CKMB", "CKMBINDEX", "TROPONINI" in the last 168 hours.  HbA1C: No results found for: "HGBA1C"  CBG: Recent Labs  Lab 07/30/22 1643 07/30/22 1950 07/30/22 2328 07/31/22 0405 07/31/22 0751  GLUCAP 106* 110* 90 102* 102*    Past Medical History:  She,  has a past medical history of Asthma, Emphysema lung ([place]), Obesity, and Stage 3 severe COPD by GOLD classification ([place]).   Surgical History:  History reviewed. No pertinent surgical history.   Social History:   reports that she has been smoking cigarettes. She has never used smokeless tobacco. She reports that she does not drink alcohol and does not use drugs.   Family History:  Her family history is not on file.   Allergies No Known Allergies   Home Medications  Prior to Admission medications   Medication Sig Start Date End Date Taking? Authorizing Provider  albuterol (VENTOLIN HFA) 108 (90 Base) MCG/ACT inhaler Inhale 2 puffs into the lungs every 6 (six) hours as needed for wheezing. 03/03/22 03/03/23 Yes [provider]  azithromycin (ZITHROMAX) 250 MG tablet Take 250 mg by mouth daily. 08/27/20  Yes [provider]  BREZTRI AEROSPHERE 160-9-4.8 MCG/ACT AERO Inhale 2 puffs into the lungs 2 (two) times daily. 09/25/21  Yes [provider]  budesonide-formoterol (SYMBICORT) 80-4.5 MCG/ACT inhaler Inhale 2 puffs into the lungs 2 (two) times daily. 12/17/21 12/17/22 Yes [provider]  ipratropium (ATROVENT) 0.02 % nebulizer solution Take 0.5 mg by nebulization every 6 (six) hours as needed for wheezing.   Yes [provider]  montelukast (SINGULAIR) 10 MG tablet Take 10 mg by mouth daily. 05/05/22 05/05/23 Yes [provider]     Critical care time: 57 minutes

## 2022-07-31 NOTE — Evaluation (Addendum)
Physical Therapy Evaluation Patient Details Name: Aimee Hooper MRN: [id number] DOB: 1961-07-12 Today's Date: 07/31/2022  History of Present Illness  Pt is a 61 y/o F admitted on 07/22/22. Pt is being treated for RSV PNA, severe COPD exacerbation, superimposed kelbsiella pneumoniae PNA, and sepsis. Pt was intubated & mechanically ventilated on 07/23/22, extubated on 07/30/22. PMH: COPD, asthma, obesity, emphysema  Clinical Impression  Pt seen for PT evaluation with pt's family present & providing encouragement & PRN assistance throughout. Prior to admission pt was independent without AD, living in a mobile home with 4 steps with B rails to enter. On this date, pt requires extensive encouragement & Max education to follow PT's instructions for increased safety with all mobility. Pt frequently states "I can't". Pt requires up to max assist to come to sitting EOB & min assist for static sitting. Attempted STS x 2 trials from elevated EOB with RW but pt with poor ability to clear buttocks. With encouragement, pt completes bed>chair via lateral scoot with MAX assist with PT providing max multimodal cuing & manual facilitation for head/hips relationship & pivoting, despite pt being able to push through BUE/BLE to assist. Pt would benefit from CIR level of therapies to maximize independence with mobility, decrease caregiver burden & fall risk prior to return home.    Pt on 3L/min via nasal cannula throughout session. Pt c/o dizziness & blurry vision sitting EOB but BP WNL. Nurse made aware.   Recommendations for follow up therapy are one component of a multi-disciplinary discharge planning process, led by the attending physician.  Recommendations may be updated based on patient status, additional functional criteria and insurance authorization.  Follow Up Recommendations Acute inpatient rehab (3hours/day)      Assistance Recommended at Discharge Frequent or constant Supervision/Assistance  Patient  can return home with the following  Two people to help with walking and/or transfers;Two people to help with bathing/dressing/bathroom;Help with stairs or ramp for entrance;Assistance with feeding;Assist for transportation;Assistance with cooking/housework;Direct supervision/assist for financial management    Equipment Recommendations  (TBD in next venue)  Recommendations for Other Services  OT consult    Functional Status Assessment Patient has had a recent decline in their functional status and demonstrates the ability to make significant improvements in function in a reasonable and predictable amount of time.     Precautions / Restrictions Precautions Precautions: Fall Restrictions Weight Bearing Restrictions: No      Mobility  Bed Mobility Overal bed mobility: Needs Assistance Bed Mobility: Supine to Sit     Supine to sit: Max assist, Mod assist     General bed mobility comments: use of bed rails, extra time & cuing to move BLE to EOB & upright trunk    Transfers Overall transfer level: Needs assistance   Transfers: Sit to/from Stand, Bed to chair/wheelchair/BSC Sit to Stand: From elevated surface, Max assist, Total assist          Lateral/Scoot Transfers: Max assist (MAX cuing for head/hips relationship, sequencing & encouragement to continue with pt frequently stating "I can't" & doing exact opposite of what PT is instructing her to do (pt elects to lean back vs forward)) General transfer comment: Attempted STS from elevated EOB x 2 attempts with pt requiring MAX cuing & assistance to place & maintain BUE on RW (pt with decreased ability to grasp AD). Pt initiates STS but unable to clear buttocks & when this occurs pt quits putting forth much effort & returns to sitting EOB.    Ambulation/Gait  Stairs            Wheelchair Mobility    Modified Rankin (Stroke Patients Only)       Balance Overall balance assessment: Needs  assistance Sitting-balance support: Feet supported, Bilateral upper extremity supported Sitting balance-Leahy Scale: Poor                                       Pertinent Vitals/Pain Pain Assessment Pain Assessment: Faces Pain Location: generalized Pain Descriptors / Indicators: Discomfort, Aching Pain Intervention(s): Monitored during session, Repositioned    Home Living Family/patient expects to be discharged to:: Private residence Living Arrangements:  (boyfriend) Available Help at Discharge: Family Type of Home: Mobile home Home Access: Stairs to enter Entrance Stairs-Rails: Right;Left;Can reach both Entrance Stairs-Number of Steps: 4   Home Layout: One level        Prior Function Prior Level of Function : Independent/Modified Independent             Mobility Comments: Pt reports she was ambulatory without AD.       Hand Dominance        Extremity/Trunk Assessment   Upper Extremity Assessment Upper Extremity Assessment: Generalized weakness    Lower Extremity Assessment Lower Extremity Assessment: Generalized weakness (3-/5 knee extension BLE in sitting)       Communication      Cognition Arousal/Alertness: Awake/alert Behavior During Therapy: Flat affect Overall Cognitive Status: Impaired/Different from baseline Area of Impairment: Attention, Memory, Following commands, Safety/judgement, Awareness, Problem solving                       Following Commands: Follows one step commands inconsistently, Follows one step commands with increased time Safety/Judgement: Decreased awareness of safety, Decreased awareness of deficits Awareness: Intellectual Problem Solving: Decreased initiation, Slow processing, Difficulty sequencing, Requires verbal cues, Requires tactile cues General Comments: Pt is AxOx4, frequently states "I can't" and doesn't follow PT's instructions during transfer despite max education re: need to for increased  safety. Fearful of falling.        General Comments      Exercises     Assessment/Plan    PT Assessment Patient needs continued PT services  PT Problem List Decreased strength;Decreased coordination;Cardiopulmonary status limiting activity;Pain;Decreased range of motion;Decreased knowledge of use of DME;Decreased activity tolerance;Decreased cognition;Decreased balance;Decreased safety awareness;Decreased mobility       PT Treatment Interventions DME instruction;Therapeutic exercise;Gait training;Balance training;Manual techniques;Wheelchair mobility training;Stair training;Neuromuscular re-education;Functional mobility training;Cognitive remediation;Therapeutic activities;Patient/family education;Modalities    PT Goals (Current goals can be found in the Care Plan section)  Acute Rehab PT Goals Patient Stated Goal: get better PT Goal Formulation: With patient/family Time For Goal Achievement: 08/14/22 Potential to Achieve Goals: Good    Frequency 7X/week     Co-evaluation               AM-PAC PT "6 Clicks" Mobility  Outcome Measure Help needed turning from your back to your side while in a flat bed without using bedrails?: A Lot Help needed moving from lying on your back to sitting on the side of a flat bed without using bedrails?: Total Help needed moving to and from a bed to a chair (including a wheelchair)?: Total Help needed standing up from a chair using your arms (e.g., wheelchair or bedside chair)?: Total Help needed to walk in hospital room?: Total Help needed climbing 3-5 steps with a  railing? : Total 6 Click Score: 7    End of Session Equipment Utilized During Treatment: Gait belt;Oxygen Activity Tolerance: Patient limited by fatigue Patient left: in chair;with call bell/phone within reach;with nursing/sitter in room;with family/visitor present Nurse Communication: Mobility status PT Visit Diagnosis: Difficulty in walking, not elsewhere classified  (R26.2);Other abnormalities of gait and mobility (R26.89);Muscle weakness (generalized) (M62.81)    Time: 5465-0354 PT Time Calculation (min) (ACUTE ONLY): 28 min   Charges:   PT Evaluation $PT Eval High Complexity: 1 High PT Treatments $Therapeutic Activity: 8-22 mins        Aleda Grana, PT, DPT 07/31/22, 4:14 PM   Sandi Mariscal 07/31/2022, 4:12 PM

## 2022-07-31 NOTE — Evaluation (Signed)
Clinical/Bedside Swallow Evaluation Patient Details  Name: Aimee Hooper MRN: [id number] Date of Birth: May 14, 1961  Today's Date: 07/31/2022 Time: SLP Start Time (ACUTE ONLY): 1315 SLP Stop Time (ACUTE ONLY): 1410 SLP Time Calculation (min) (ACUTE ONLY): 55 min  Past Medical History:  Past Medical History:  Diagnosis Date   Asthma    Emphysema lung (HCC)    Obesity    Stage 3 severe COPD by GOLD classification (HCC)    Past Surgical History: History reviewed. No pertinent surgical history. HPI:  Pt is a 61 y.o. female history of Tobacco abuse/use, COPD, Obesity, and Asthma not on home O2 (per patient initially, but later told TOC she was on 2.5L O2 at home) who was diagnosed with RSV then presented to the emergency department in severe  respiratory distress w extensive Wheezing, shortness of breath.  Severe increased WOB and using accessory muscles to breathe.  Post admit, pt was on BiPAP then orally intubated/extubated.   CXR: No acute cardiopulmonary disease.    Assessment / Plan / Recommendation  Clinical Impression   Pt seen for BSE today. Pt awake and alert x3. Increased WOB w/ any exertion. Verbal but could only respond in ~2-3 words at one time d/t SOB and low volume. Noted a raspy vocal quality -- s/p lengthy extubation yesterday. Family present. Hacking cough noted at Baseline, especially w/ exertion.  Pt on  O2 support - 3L; wbc elevated but declining. Afebrile.   Pt appears to present w/ pharyngeal phase dysphagia w/ pharyngeal phase deficits and suspected Neuromuscular impact d/t lengthy oral intubation(~7 days). Per chart notes, pt was agitated during the intubation w/ increased movements which could have increased trauma from the ETT on the vocal cords. Pt's vocal quality is raspy; cough is strong but raspy w/ decreased effectiveness in protecting airway from any potential aspiration(suspected). Pt is easily SOB w/ WOB w/ ANY exertion.   Pt required min-mod  tactile/verbal/visual cues for orientation to task during bolus presentation, follow through w/ tasks, and self-feeding support. She helped to feed self w/ setup support w/ MOD+ support d/t overall generalized weakness. Pt consumed trials of single ice chips, thin liquids via Cup, and purees. Immediate/delayed, overt clinical s/s of aspiration noted w/ trials of thin liquids(coughing, throat clearing). W/ trials of purees and single ice chips, no immediate cough and no decline in respiratory presentation noted during/post trials(89-90% as at baseline). However, a mild, delayed throat clearing was noted x1 w/ each consistency. Pt denied feeling that it was "choking" her, but pt did endorse "funny feeling". Rest Breaks were given b/t trails to lessen any increased WOB -- no accessory muscle breathing noted during assessment. Suspect potential for decreased pharyngeal sensation secondary to lengthy oral intubation as well as potential delayed pharyngeal swallowing initiation which could impact safety of pharyngeal swallow w/ po's.  Oral phase was c/b adequate bolus management and oral clearing of all boluses given. A-P transfer was [facility]; no bolus residue remained post swallows.  OM exam appeared [facility] w/ no unilateral weakness noted. Lingual strength was appropriate. Pt helped to hold cup when drinking which increases safety of swallowing.   Recommend continue NPO status w/ therapeutic, single ice chip trials w/ NSG Supervision s/p oral care; applesauce trials w/ NSG Supervision. Strict aspiration precautions. Feeding support. Reduce Distractions. NSG/MD/Family updated. ST services will f/u w/ ongoing po trials; assessment. Will determine need for objective swallow assessment next 2-3 days.  SLP Visit Diagnosis: Dysphagia, pharyngeal phase (R13.13)    Aspiration Risk  Moderate aspiration risk;Risk for inadequate nutrition/hydration    Diet Recommendation   NPO for oral diet  Medication Administration: Via  alternative means    Other  Recommendations Recommended Consults:  (Dietician f/u; possible need for ENT) Oral Care Recommendations: Oral care QID;Oral care prior to ice chip/H20;Staff/trained caregiver to provide oral care (setup support) Other Recommendations:  (TBD)    Recommendations for follow up therapy are one component of a multi-disciplinary discharge planning process, led by the attending physician.  Recommendations may be updated based on patient status, additional functional criteria and insurance authorization.  Follow up Recommendations Skilled nursing-short term rehab (<3 hours/day) (TBD)      Assistance Recommended at Discharge    Functional Status Assessment Patient has had a recent decline in their functional status and demonstrates the ability to make significant improvements in function in a reasonable and predictable amount of time.  Frequency and Duration min 3x week  2 weeks       Prognosis Prognosis for Safe Diet Advancement: Fair Barriers to Reach Goals: Time post onset;Severity of deficits;Motivation;Behavior Barriers/Prognosis Comment: lengthy oral intubation; COPD/asthma      Swallow Study   General Date of Onset: 07/22/22 HPI: Pt is a 61 y.o. female history of Tobacco abuse/use, COPD, Obesity, and Asthma not on home O2 (per patient initially, but later told TOC she was on 2.5L O2 at home) who was diagnosed with RSV then presented to the emergency department in severe  respiratory distress w extensive Wheezing, shortness of breath.  Severe increased WOB and using accessory muscles to breathe.  Post admit, pt was on BiPAP then orally intubated/extubated.   CXR: No acute cardiopulmonary disease. Type of Study: Bedside Swallow Evaluation Previous Swallow Assessment: none Diet Prior to this Study: NPO (regular diet at home) Temperature Spikes Noted: No (wbc 14.6 declineing) Respiratory Status: Nasal cannula (3L) History of Recent Intubation: Yes Length of  Intubations (days): 7 days Date extubated: 07/30/22 Behavior/Cognition: Alert;Cooperative;Pleasant mood;Distractible;Requires cueing Oral Cavity Assessment: Dry Oral Care Completed by SLP: Yes Oral Cavity - Dentition: Edentulous (wears dentures(at home)) Vision: Functional for self-feeding Self-Feeding Abilities: Able to feed self;Needs assist;Needs set up;Total assist Patient Positioning: Upright in bed (needed full support to sit upright in bed -- WOB increased w/ the exertion) Baseline Vocal Quality: Hoarse (Raspy - s/p extubation) Volitional Cough: Strong (raspy) Volitional Swallow: Able to elicit    Oral/Motor/Sensory Function Overall Oral Motor/Sensory Function: Within functional limits   Ice Chips Ice chips: Within functional limits Presentation: Spoon (fed; 9 trials) Other Comments: mild throat clear-cough x1 at end of the trials   Thin Liquid Thin Liquid: Impaired Presentation: Cup;Self Fed (supported; 3 trials) Oral Phase Impairments:  (none) Pharyngeal  Phase Impairments: Cough - Immediate;Cough - Delayed (x3/3 trials)    Nectar Thick Nectar Thick Liquid: Not tested   Honey Thick Honey Thick Liquid: Not tested   Puree Puree: Impaired Presentation: Spoon (fed; 3 trials) Oral Phase Impairments:  (none) Pharyngeal Phase Impairments: Throat Clearing - Delayed (x1/3 trials at end of trials)   Solid     Solid: Not tested        Jerilynn Som, MS, CCC-SLP Speech Language Pathologist Rehab Services; [facility] [place] [phone] (ascom) Marianne Golightly 07/31/2022,3:52 PM

## 2022-08-01 DIAGNOSIS — J9621 Acute and chronic respiratory failure with hypoxia: Secondary | ICD-10-CM | POA: Diagnosis not present

## 2022-08-01 DIAGNOSIS — J441 Chronic obstructive pulmonary disease with (acute) exacerbation: Secondary | ICD-10-CM | POA: Diagnosis not present

## 2022-08-01 DIAGNOSIS — E873 Alkalosis: Secondary | ICD-10-CM | POA: Diagnosis not present

## 2022-08-01 DIAGNOSIS — J189 Pneumonia, unspecified organism: Secondary | ICD-10-CM | POA: Diagnosis not present

## 2022-08-01 LAB — PHOSPHORUS: Phosphorus: 2.6 mg/dL (ref 2.5–4.6)

## 2022-08-01 LAB — GLUCOSE, CAPILLARY
Glucose-Capillary: 114 mg/dL — ABNORMAL HIGH (ref 70–99)
Glucose-Capillary: 125 mg/dL — ABNORMAL HIGH (ref 70–99)
Glucose-Capillary: 132 mg/dL — ABNORMAL HIGH (ref 70–99)
Glucose-Capillary: 88 mg/dL (ref 70–99)
Glucose-Capillary: 96 mg/dL (ref 70–99)

## 2022-08-01 LAB — BASIC METABOLIC PANEL
Anion gap: 7 (ref 5–15)
BUN: 17 mg/dL (ref 8–23)
CO2: 29 mmol/L (ref 22–32)
Calcium: 8.9 mg/dL (ref 8.9–10.3)
Chloride: 105 mmol/L (ref 98–111)
Creatinine, Ser: 0.37 mg/dL — ABNORMAL LOW (ref 0.44–1.00)
GFR, Estimated: >60 mL/min (ref >60)
Glucose, Bld: 85 mg/dL (ref 70–99)
Potassium: 3.4 mmol/L — ABNORMAL LOW (ref 3.5–5.1)
Sodium: 141 mmol/L (ref 135–145)

## 2022-08-01 LAB — CBC
HCT: 33.4 % — ABNORMAL LOW (ref 36.0–46.0)
Hemoglobin: 10 g/dL — ABNORMAL LOW (ref 12.0–15.0)
MCH: 21.5 pg — ABNORMAL LOW (ref 26.0–34.0)
MCHC: 29.9 g/dL — ABNORMAL LOW (ref 30.0–36.0)
MCV: 71.8 fL — ABNORMAL LOW (ref 80.0–100.0)
Platelets: 238 10*3/uL (ref 150–400)
RBC: 4.65 MIL/uL (ref 3.87–5.11)
RDW: 15.9 % — ABNORMAL HIGH (ref 11.5–15.5)
WBC: 14 10*3/uL — ABNORMAL HIGH (ref 4.0–10.5)
nRBC: 0 % (ref 0.0–0.2)

## 2022-08-01 LAB — MAGNESIUM: Magnesium: 2 mg/dL (ref 1.7–2.4)

## 2022-08-01 MED ORDER — TRAZODONE HCL 50 MG PO TABS
25.0000 mg | ORAL_TABLET | Freq: Every evening | ORAL | Status: DC | PRN
  Administered 2022-08-01: 25 mg via ORAL
  Filled 2022-08-01 (×2): qty 1

## 2022-08-01 MED ORDER — POTASSIUM CHLORIDE 10 MEQ/50ML IV SOLN
10.0000 meq | INTRAVENOUS | Status: AC
  Administered 2022-08-01 (×4): 10 meq via INTRAVENOUS
  Filled 2022-08-01 (×4): qty 50

## 2022-08-01 MED ORDER — PREDNISONE 20 MG PO TABS
20.0000 mg | ORAL_TABLET | Freq: Every day | ORAL | Status: DC
  Administered 2022-08-02 – 2022-08-04 (×3): 20 mg via ORAL
  Filled 2022-08-01 (×3): qty 1

## 2022-08-01 NOTE — Consult Note (Signed)
Reason for Consult:diplopia and blurred vision Referring Physician: Dgayli - ICU  Aimee Hooper is an 61 y.o. female.  Chief complaint: diplopia since extubation Respiratory failure (HCC)  HPI: 61 yo WF c/o diplopia and "film over vision OD" since extubation.  She says about 5 days diplopia, but according to chart, extubation 2 days ago.  Admitted 11/15 with COPD exacerbation, pneumonia and has c/o diplopia since sedation lifted.  Was hypotensive with sedation according to notes, but now normotensive.  POH- denies any ocular surgeries or problems and wears only readers prn.  Past Medical History:  Diagnosis Date   Asthma    Emphysema lung (HCC)    Obesity    Stage 3 severe COPD by GOLD classification (HCC)     ROS  History reviewed. No pertinent surgical history.  History reviewed. No pertinent family history.  Social History:  reports that she has been smoking cigarettes. She has never used smokeless tobacco. She reports that she does not drink alcohol and does not use drugs.  Allergies: No Known Allergies  Medications: Scheduled:  Chlorhexidine Gluconate Cloth  6 each Topical Daily   ipratropium-albuterol  3 mL Nebulization Q6H   [START ON 08/02/2022] predniSONE  20 mg Oral Q breakfast   sodium chloride flush  10-40 mL Intracatheter Q12H   Continuous:  acetaminophen Stopped (07/31/22 1842)   piperacillin-tazobactam (ZOSYN)  IV 3.375 g (08/01/22 1445)    Results for orders placed or performed during the hospital encounter of 07/22/22 (from the past 48 hour(s))  Glucose, capillary     Status: Abnormal   Collection Time: 07/30/22  4:43 PM  Result Value Ref Range   Glucose-Capillary 106 (H) 70 - 99 mg/dL    Comment: Glucose reference range applies only to samples taken after fasting for at least 8 hours.  Glucose, capillary     Status: Abnormal   Collection Time: 07/30/22  7:50 PM  Result Value Ref Range   Glucose-Capillary 110 (H) 70 - 99 mg/dL    Comment:  Glucose reference range applies only to samples taken after fasting for at least 8 hours.  Glucose, capillary     Status: None   Collection Time: 07/30/22 11:28 PM  Result Value Ref Range   Glucose-Capillary 90 70 - 99 mg/dL    Comment: Glucose reference range applies only to samples taken after fasting for at least 8 hours.  Glucose, capillary     Status: Abnormal   Collection Time: 07/31/22  4:05 AM  Result Value Ref Range   Glucose-Capillary 102 (H) 70 - 99 mg/dL    Comment: Glucose reference range applies only to samples taken after fasting for at least 8 hours.  Basic metabolic panel     Status: Abnormal   Collection Time: 07/31/22  4:32 AM  Result Value Ref Range   Sodium 144 135 - 145 mmol/L   Potassium 3.3 (L) 3.5 - 5.1 mmol/L   Chloride 107 98 - 111 mmol/L   CO2 31 22 - 32 mmol/L   Glucose, Bld 92 70 - 99 mg/dL    Comment: Glucose reference range applies only to samples taken after fasting for at least 8 hours.   BUN 19 8 - 23 mg/dL   Creatinine, Ser 3.29 (L) 0.44 - 1.00 mg/dL   Calcium 8.7 (L) 8.9 - 10.3 mg/dL   GFR, Estimated >92 >42 mL/min    Comment: (NOTE) Calculated using the CKD-EPI Creatinine Equation (2021)    Anion gap 6 5 - 15  Comment: Performed at [facility], [address] [place], [place] [address]  CBC     Status: Abnormal   Collection Time: 07/31/22  4:32 AM  Result Value Ref Range   WBC 14.6 (H) 4.0 - 10.5 K/uL   RBC 3.99 3.87 - 5.11 MIL/uL   Hemoglobin 8.6 (L) 12.0 - 15.0 g/dL    Comment: Reticulocyte Hemoglobin testing may be clinically indicated, consider ordering this additional test [id number]    HCT 28.5 (L) 36.0 - 46.0 %   MCV 71.4 (L) 80.0 - 100.0 fL   MCH 21.6 (L) 26.0 - 34.0 pg   MCHC 30.2 30.0 - 36.0 g/dL   RDW 18.8 (H) 41.6 - 60.6 %   Platelets 181 150 - 400 K/uL   nRBC 0.0 0.0 - 0.2 %    Comment: Performed at [facility], [address] [place], [place] [address]  Glucose, capillary     Status: Abnormal    Collection Time: 07/31/22  7:51 AM  Result Value Ref Range   Glucose-Capillary 102 (H) 70 - 99 mg/dL    Comment: Glucose reference range applies only to samples taken after fasting for at least 8 hours.  Glucose, capillary     Status: None   Collection Time: 07/31/22  1:16 PM  Result Value Ref Range   Glucose-Capillary 91 70 - 99 mg/dL    Comment: Glucose reference range applies only to samples taken after fasting for at least 8 hours.  Glucose, capillary     Status: None   Collection Time: 07/31/22  5:32 PM  Result Value Ref Range   Glucose-Capillary 95 70 - 99 mg/dL    Comment: Glucose reference range applies only to samples taken after fasting for at least 8 hours.  Glucose, capillary     Status: None   Collection Time: 07/31/22  7:45 PM  Result Value Ref Range   Glucose-Capillary 90 70 - 99 mg/dL    Comment: Glucose reference range applies only to samples taken after fasting for at least 8 hours.  Glucose, capillary     Status: None   Collection Time: 07/31/22 11:26 PM  Result Value Ref Range   Glucose-Capillary 85 70 - 99 mg/dL    Comment: Glucose reference range applies only to samples taken after fasting for at least 8 hours.  Basic metabolic panel     Status: Abnormal   Collection Time: 08/01/22  5:33 AM  Result Value Ref Range   Sodium 141 135 - 145 mmol/L   Potassium 3.4 (L) 3.5 - 5.1 mmol/L   Chloride 105 98 - 111 mmol/L   CO2 29 22 - 32 mmol/L   Glucose, Bld 85 70 - 99 mg/dL    Comment: Glucose reference range applies only to samples taken after fasting for at least 8 hours.   BUN 17 8 - 23 mg/dL   Creatinine, Ser 1.09 (L) 0.44 - 1.00 mg/dL   Calcium 8.9 8.9 - 32.3 mg/dL   GFR, Estimated >55 >73 mL/min    Comment: (NOTE) Calculated using the CKD-EPI Creatinine Equation (2021)    Anion gap 7 5 - 15    Comment: Performed at [facility], [address] [place], [place] [address]  CBC     Status: Abnormal   Collection Time: 08/01/22  5:33 AM  Result  Value Ref Range   WBC 14.0 (H) 4.0 - 10.5 K/uL   RBC 4.65 3.87 - 5.11 MIL/uL   Hemoglobin 10.0 (L) 12.0 -  15.0 g/dL   HCT 80.9 (L) 98.3 - 38.2 %   MCV 71.8 (L) 80.0 - 100.0 fL   MCH 21.5 (L) 26.0 - 34.0 pg   MCHC 29.9 (L) 30.0 - 36.0 g/dL   RDW 50.5 (H) 39.7 - 67.3 %   Platelets 238 150 - 400 K/uL   nRBC 0.0 0.0 - 0.2 %    Comment: Performed at [facility], [address], [place], [place] [address]  Magnesium     Status: None   Collection Time: 08/01/22  5:33 AM  Result Value Ref Range   Magnesium 2.0 1.7 - 2.4 mg/dL    Comment: Performed at [facility], [address] [place], [place] [address]  Phosphorus     Status: None   Collection Time: 08/01/22  5:33 AM  Result Value Ref Range   Phosphorus 2.6 2.5 - 4.6 mg/dL    Comment: Performed at [facility], [address] [place], [place] [address]  Glucose, capillary     Status: None   Collection Time: 08/01/22  7:31 AM  Result Value Ref Range   Glucose-Capillary 88 70 - 99 mg/dL    Comment: Glucose reference range applies only to samples taken after fasting for at least 8 hours.  Glucose, capillary     Status: Abnormal   Collection Time: 08/01/22 11:47 AM  Result Value Ref Range   Glucose-Capillary 125 (H) 70 - 99 mg/dL    Comment: Glucose reference range applies only to samples taken after fasting for at least 8 hours.  Glucose, capillary     Status: Abnormal   Collection Time: 08/01/22  4:05 PM  Result Value Ref Range   Glucose-Capillary 132 (H) 70 - 99 mg/dL    Comment: Glucose reference range applies only to samples taken after fasting for at least 8 hours.    No results found.  Blood pressure (!) 144/58, pulse 80, temperature 98.6 F (37 C), temperature source Oral, resp. rate 16, height 5' (1.524 m), weight 83 kg, SpO2 92 %.  Mental status: Alert and Oriented x 4  Visual Acuity:  20/25 OD  20/70 near [place]  Pupils:  Equally round/ reactive to light.  No Afferent defect.  Motility:   -2 abduction of OD.  ET in primary, worse in R gaze.  Ortho in L gaze.  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  Unless other neurologic signs, should not need imaging.  Patch 1 eye if needed.  Expect resolution in 1-3 months typically, sometimes 6-12 months.  Occasionally permanent.    Ocular hypertension - f/u as outpatient.  Sub conj heme OD with chemosis - lubricate with artificial tears.    F/u [facility] approx 1 month after discharge.    Aimee Hooper 08/01/2022, 4:29 PM

## 2022-08-01 NOTE — Progress Notes (Signed)
Physical Therapy Treatment Patient Details Name: Aimee Hooper MRN: [id number] DOB: Jun 06, 1961 Today's Date: 08/01/2022   History of Present Illness Pt is a 62 y/o F admitted on 07/22/22. Pt is being treated for RSV PNA, severe COPD exacerbation, superimposed kelbsiella pneumoniae PNA, and sepsis. Pt was intubated & mechanically ventilated on 07/23/22, extubated on 07/30/22. PMH: COPD, asthma, obesity, emphysema    PT Comments    PT/OT co-treat/eval for safety. Pt is alert and cooperative. Motivated to get OOB and improve. She required max assist to safely progress from long sitting to short sitting EOB. +2 assistance to safely stand at EOB prior to pivoting to recliner from EOB. Pt gets SOB with minimal activity. Overall did tolerate session well but is far from baseline abilities. Continued recommendation for CIR at DC.   Recommendations for follow up therapy are one component of a multi-disciplinary discharge planning process, led by the attending physician.  Recommendations may be updated based on patient status, additional functional criteria and insurance authorization.  Follow Up Recommendations  Acute inpatient rehab (3hours/day)     Assistance Recommended at Discharge Frequent or constant Supervision/Assistance  Patient can return home with the following Two people to help with walking and/or transfers;Two people to help with bathing/dressing/bathroom;Help with stairs or ramp for entrance;Assistance with feeding;Assist for transportation;Assistance with cooking/housework;Direct supervision/assist for financial management   Equipment Recommendations  Other (comment) (defer to next level of care)       Precautions / Restrictions Precautions Precautions: Fall Restrictions Weight Bearing Restrictions: No     Mobility  Bed Mobility Overal bed mobility: Needs Assistance Bed Mobility: Supine to Sit  Supine to sit: Max assist  General bed mobility comments: max of one     Transfers Overall transfer level: Needs assistance Equipment used: Rolling walker (2 wheels), 2 person hand held assist Transfers: Sit to/from Stand, Bed to chair/wheelchair/BSC Sit to Stand: Min assist, Mod assist, +2 physical assistance, +2 safety/equipment    Lateral/Scoot Transfers: Min assist, Mod assist, +2 physical assistance, +2 safety/equipment General transfer comment: Sit <>Stand x 2 from bed. Pt tolerates standing ~ 1 minute for therapist to assist with hygiene    Ambulation/Gait  General Gait Details: more so pivot to recliner versus walking to recliner    Balance Overall balance assessment: Needs assistance Sitting-balance support: Feet supported, Bilateral upper extremity supported Sitting balance-Leahy Scale: Fair     Standing balance support: Reliant on assistive device for balance, During functional activity, Bilateral upper extremity supported Standing balance-Leahy Scale: Poor Standing balance comment: +2 assistance      Cognition Arousal/Alertness: Awake/alert Behavior During Therapy: WFL for tasks assessed/performed Overall Cognitive Status: Within Functional Limits for tasks assessed      General Comments: Pt was A and agreeable to OOB activity. PT/OT co-treat/eval due to pt requiring +2 assistance for safety           General Comments General comments (skin integrity, edema, etc.): pt has very poor activity tolerance.      Pertinent Vitals/Pain Pain Assessment Pain Assessment: No/denies pain Faces Pain Scale: No hurt    Home Living Family/patient expects to be discharged to:: Private residence Living Arrangements: Other (Comment) (boyfriend) Available Help at Discharge: Family Type of Home: Mobile home Home Access: Stairs to enter Entrance Stairs-Rails: Right;Left;Can reach both Entrance Stairs-Number of Steps: 4   Home Layout: One level Home Equipment: None;Other (comment) Additional Comments: oxygen and pulse ox    Prior Function  PT Goals (current goals can now be found in the care plan section) Acute Rehab PT Goals Patient Stated Goal: get better Progress towards PT goals: Progressing toward goals    Frequency    7X/week      PT Plan Current plan remains appropriate    Co-evaluation   Reason for Co-Treatment: For patient/therapist safety;To address functional/ADL transfers PT goals addressed during session: Mobility/safety with mobility;Balance;Proper use of DME;Strengthening/ROM OT goals addressed during session: Proper use of Adaptive equipment and DME      AM-PAC PT "6 Clicks" Mobility   Outcome Measure  Help needed turning from your back to your side while in a flat bed without using bedrails?: A Lot Help needed moving from lying on your back to sitting on the side of a flat bed without using bedrails?: A Lot Help needed moving to and from a bed to a chair (including a wheelchair)?: Total Help needed standing up from a chair using your arms (e.g., wheelchair or bedside chair)?: Total Help needed to walk in hospital room?: Total Help needed climbing 3-5 steps with a railing? : Total 6 Click Score: 8    End of Session Equipment Utilized During Treatment: Gait belt;Oxygen Activity Tolerance: Patient limited by fatigue Patient left: in chair;with call bell/phone within reach;with nursing/sitter in room;with family/visitor present Nurse Communication: Mobility status PT Visit Diagnosis: Difficulty in walking, not elsewhere classified (R26.2);Other abnormalities of gait and mobility (R26.89);Muscle weakness (generalized) (M62.81)     Time: 8453-6468 PT Time Calculation (min) (ACUTE ONLY): 21 min  Charges:  $Therapeutic Activity: 8-22 mins                    Jetta Lout PTA 08/01/22, 2:12 PM

## 2022-08-01 NOTE — Evaluation (Signed)
Occupational Therapy Evaluation Patient Details Name: Aimee Hooper MRN: [id number] DOB: 1961-02-20 Today's Date: 08/01/2022   History of Present Illness Pt is a 61 y/o F admitted on 07/22/22. Pt is being treated for RSV PNA, severe COPD exacerbation, superimposed kelbsiella pneumoniae PNA, and sepsis. Pt was intubated & mechanically ventilated on 07/23/22, extubated on 07/30/22. PMH: COPD, asthma, obesity, emphysema   Clinical Impression   Patient presenting with decreased Ind in self care,balance, functional mobility/transfers, endurance, and safety awareness. Patient report being Ind at baseline without use of AD and living with boyfriend. She has recently needed to use home O2 of 2 Ls via  at night only. Pt is motivated for therapeutic intervention and requesting assistance to attempt transfer to recliner chair. Pt performs bed mobility with max A and sits with close supervision. Pt has urinated on bed linen and unaware. She stands with min - mod A of 2 for 1 minute for therapist to assist with hygiene. Pt stands x2 reps this session and then step pivots to recliner chair with mod A of 2. She does fatigue quickly but is very motivated. Patient will benefit from acute OT to increase overall independence in the areas of ADLs, functional mobility, and safety awareness in order to safely discharge to next venue of care.      Recommendations for follow up therapy are one component of a multi-disciplinary discharge planning process, led by the attending physician.  Recommendations may be updated based on patient status, additional functional criteria and insurance authorization.   Follow Up Recommendations  Acute inpatient rehab (3hours/day)     Assistance Recommended at Discharge Intermittent Supervision/Assistance  Patient can return home with the following A lot of help with walking and/or transfers;A lot of help with bathing/dressing/bathroom;Assistance with cooking/housework;Help with  stairs or ramp for entrance;Assist for transportation    Functional Status Assessment  Patient has had a recent decline in their functional status and demonstrates the ability to make significant improvements in function in a reasonable and predictable amount of time.  Equipment Recommendations  Other (comment) (defer to next venue of care)       Precautions / Restrictions Precautions Precautions: Fall Restrictions Weight Bearing Restrictions: No      Mobility Bed Mobility Overal bed mobility: Needs Assistance Bed Mobility: Supine to Sit     Supine to sit: Max assist     General bed mobility comments: assistance for B LEs and trunk support    Transfers Overall transfer level: Needs assistance Equipment used: Rolling walker (2 wheels), 2 person hand held assist Transfers: Sit to/from Stand, Bed to chair/wheelchair/BSC Sit to Stand: Min assist, Mod assist, +2 physical assistance, +2 safety/equipment          Lateral/Scoot Transfers: Min assist, Mod assist, +2 physical assistance, +2 safety/equipment General transfer comment: Sit <>Stand x 2 from bed. Pt tolerates standing ~ 1 minute for therapist to assist with hygiene      Balance Overall balance assessment: Needs assistance Sitting-balance support: Feet supported, Bilateral upper extremity supported Sitting balance-Leahy Scale: Fair     Standing balance support: Reliant on assistive device for balance, During functional activity, Bilateral upper extremity supported Standing balance-Leahy Scale: Poor Standing balance comment: +2 assistance                           ADL either performed or assessed with clinical judgement   ADL Overall ADL's : Needs assistance/impaired  Toilet Transfer: Minimal assistance;Moderate assistance;Stand-pivot;Ambulation;Rolling walker (2 wheels) Toilet Transfer Details (indicate cue type and reason): simulated           General ADL  Comments: Pt standing with min - mod A standing balance and needing assistance for hygiene as gown and bed linens are soiled.     Vision Patient Visual Report: No change from baseline              Pertinent Vitals/Pain Pain Assessment Pain Assessment: Faces Faces Pain Scale: No hurt     Hand Dominance Right   Extremity/Trunk Assessment Upper Extremity Assessment Upper Extremity Assessment: Generalized weakness   Lower Extremity Assessment Lower Extremity Assessment: Generalized weakness       Communication Communication Communication: No difficulties   Cognition Arousal/Alertness: Awake/alert Behavior During Therapy: WFL for tasks assessed/performed                           Following Commands: Follows one step commands with increased time, Follows one step commands consistently Safety/Judgement: Decreased awareness of safety, Decreased awareness of deficits Awareness: Intellectual Problem Solving: Decreased initiation, Slow processing, Difficulty sequencing, Requires verbal cues, Requires tactile cues General Comments: Pt is pleasant and motivated with goal to get into chair this session                Home Living Family/patient expects to be discharged to:: Private residence Living Arrangements: Other (Comment) (boyfriend) Available Help at Discharge: Family Type of Home: Mobile home Home Access: Stairs to enter Entrance Stairs-Number of Steps: 4 Entrance Stairs-Rails: Right;Left;Can reach both Home Layout: One level     Bathroom Shower/Tub: Tub/shower unit         Home Equipment: None;Other (comment)   Additional Comments: oxygen and pulse ox      Prior Functioning/Environment Prior Level of Function : Independent/Modified Independent             Mobility Comments: Pt reports she was ambulatory without AD. ADLs Comments: Ind in ADLs and IADLs. Recently using 2Ls of via [place] at night        OT Problem List: Decreased  strength;Pain;Decreased activity tolerance;Decreased safety awareness;Impaired balance (sitting and/or standing);Decreased knowledge of use of DME or AE      OT Treatment/Interventions: Self-care/ADL training;Therapeutic exercise;Therapeutic activities;Energy conservation;DME and/or AE instruction;Patient/family education;Balance training    OT Goals(Current goals can be found in the care plan section) Acute Rehab OT Goals Patient Stated Goal: to get stronger and return home OT Goal Formulation: With patient/family Time For Goal Achievement: 08/15/22 Potential to Achieve Goals: Fair ADL Goals Pt Will Perform Grooming: with min assist;standing Pt Will Perform Lower Body Dressing: with min assist;sit to/from stand Pt Will Transfer to Toilet: with min assist;ambulating Pt Will Perform Toileting - Clothing Manipulation and hygiene: with min assist;sit to/from stand  OT Frequency: Min 4X/week    Co-evaluation PT/OT/SLP Co-Evaluation/Treatment: Yes Reason for Co-Treatment: For patient/therapist safety;To address functional/ADL transfers PT goals addressed during session: Mobility/safety with mobility OT goals addressed during session: Proper use of Adaptive equipment and DME      AM-PAC OT "6 Clicks" Daily Activity     Outcome Measure Help from another person eating meals?: None Help from another person taking care of personal grooming?: A Little Help from another person toileting, which includes using toliet, bedpan, or urinal?: A Lot Help from another person bathing (including washing, rinsing, drying)?: A Lot Help from another person to put on and taking off regular upper  body clothing?: A Little Help from another person to put on and taking off regular lower body clothing?: A Lot 6 Click Score: 16   End of Session Equipment Utilized During Treatment: Rolling walker (2 wheels) Nurse Communication: Mobility status  Activity Tolerance: Patient tolerated treatment well;Patient limited  by fatigue Patient left: in bed;with call bell/phone within reach;with bed alarm set  OT Visit Diagnosis: Unsteadiness on feet (R26.81);Repeated falls (R29.6);Muscle weakness (generalized) (M62.81)                Time: 9381-8299 OT Time Calculation (min): 27 min Charges:  OT General Charges $OT Visit: 1 Visit OT Evaluation $OT Eval Moderate Complexity: 1 [address], [place], OTR/L , CBIS ascom [phone]  08/01/22, 10:50 AM

## 2022-08-01 NOTE — Progress Notes (Signed)
Inpatient Rehab Admissions Coordinator:   Per therapy recommendations, patient was screened for CIR candidacy by Megan Salon, MS, CCC-SLP. At this time, Pt. is not demonstrating ability to tolerate the intensity of CIR; however,   Pt. may have potential to progress to becoming a potential CIR candidate, so CIR admissions team will follow and monitor for progress and participation with therapies and place consult order if Pt. appears to be an appropriate candidate. Please contact me with any questions.   Megan Salon, MS, CCC-SLP Rehab Admissions Coordinator  [phone] (celll) [phone] (office)

## 2022-08-01 NOTE — Progress Notes (Signed)
PHARMACY CONSULT NOTE  Pharmacy Consult for Electrolyte Monitoring and Replacement   Recent Labs: Potassium (mmol/L)  Date Value  08/01/2022 3.4 (L)   Magnesium (mg/dL)  Date Value  73/53/2992 2.0   Calcium (mg/dL)  Date Value  42/68/3419 8.9   Albumin (g/dL)  Date Value  62/22/9798 3.4 (L)   Phosphorus (mg/dL)  Date Value  92/07/9416 2.6   Sodium (mmol/L)  Date Value  08/01/2022 141   Assessment: 61 y/o female with h/o COPD who is admitted with COPD exacerbation, RSV and CAP.   Pt sedated and ventilated. OGT removed 11/23;   Goal of Therapy:  Electrolytes within normal limits  Plan:  K 3.3>3.4: After receiving KCL IV q1h x4 doses on 11/24 Will repeat KCL IV q1h x4 doses (per CCM NP) All other electrolytes WNL, No electrolyte replacement warranted for today Follow-up electrolytes with AM labs tomorrow  Martyn Malay 08/01/2022 9:17 AM

## 2022-08-01 NOTE — Progress Notes (Signed)
Speech Language Pathology Treatment: Dysphagia  Patient Details Name: Aimee Hooper MRN: [id number] DOB: 1960-11-21 Today's Date: 08/01/2022 Time: 5956-3875 SLP Time Calculation (min) (ACUTE ONLY): 40 min  Assessment / Plan / Recommendation Clinical Impression   Aimee Hooper seen for ongoing assessment today in hopes to upgrade to an oral diet; education w/ Aimee Hooper present on need for aspiration precautions. Aimee Hooper. Increased WOB w/ any exertion. Verbal w/ much improved vocal quality and volume of speech; less raspy vocal quality(lengthy intubation/extubation). Aimee Hooper present. Dry, hacking cough noted at Baseline intermittently, especially w/ exertion. Aimee Hooper and Aimee Hooper stated Aimee Hooper did this at home as well d/t "smoking cigarettes".  Aimee Hooper on [place] O2 support - 3L; wbc elevated but declining. Afebrile.    Aimee Hooper appears to present w/ risk for pharyngeal phase dysphagia in setting of lengthy oral intubation; per chart notes, Aimee Hooper was agitated during the intubation w/ increased movements which could have increased trauma from the ETT on the vocal cords. Aimee Hooper is easily SOB w/ WOB w/ ANY exertion.  Risk for aspiration is decreased following aspiration precautions.    Aimee Hooper required min-mod cues for feeding support and follow through w/ precautions during po tasks; self-feeding was challenging for her d/t weak UEs, generalized weakness. Aimee Hooper consumed trials of single ice chips, thin liquids via Cup, and purees/minced solids w/ no consistent, overt clinical s/s of aspiration noted w/ trials; however, immediate coughing x1 post trial of thin liquid when Aimee Hooper stated Aimee Hooper "took a large sip". This was Not noted again given instruction to keep sips SMALL and SINGLE. W/ further trials of liquids, purees and minced foods moistened in puree, no overt s/s of aspiration noted; vocal quality remained clear and no decline in respiratory presentation noted during/post trials(93% as at baseline). No use of accessory muscle during  respirations. Frequent REST BREAKS given b/t trials -- educated Aimee Hooper on the importance of this to allow Aimee Hooper to clear fully b/t bites/sips and to maintain a calm breathing presentation. Rest Breaks can help to lessen any increased WOB.   Oral phase was c/b adequate bolus management, mashing/gumming, and oral clearing of all boluses given. A-P transfer was [facility]; no bolus residue remained post swallows.    Recommend initiation of a Dysphagia level 2 (minced foods w/ purees); thin liquids VIA CUP. Strict aspiration precautions as posted. Feeding support. Rest Breaks to support Pulmonary status. Reduce distractions during all oral intake. NSG/MD/Aimee Hooper updated. ST services will f/u w/ ongoing assessment; education. Suspect this diet consistency will be most beneficial in setting of Pulmonary status/decline and Edentulous status.       HPI HPI: Aimee Hooper is a 61 y.o. female history of Tobacco abuse/use, COPD, Obesity, and Asthma not on home O2 (per patient initially, but later told TOC Aimee Hooper was on 2.5L O2 at home) who was diagnosed with RSV then presented to the emergency department in severe  respiratory distress w extensive Wheezing, shortness of breath.  Severe increased WOB and using accessory muscles to breathe.  Post admit, Aimee Hooper was on BiPAP then orally intubated/extubated.   CXR: No acute cardiopulmonary disease.      SLP Plan  Continue with current plan of care      Recommendations for follow up therapy are one component of a multi-disciplinary discharge planning process, led by the attending physician.  Recommendations may be updated based on patient status, additional functional criteria and insurance authorization.    Recommendations  Diet recommendations: Dysphagia 2 (fine chop);Thin liquid Liquids provided via: Cup;No straw Medication  Administration: Crushed with puree (vs Whole in puree w/ NSG) Supervision: Staff to assist with self feeding;Patient able to self feed;Full supervision/cueing for  compensatory strategies Compensations: Minimize environmental distractions;Slow rate;Small sips/bites;Lingual sweep for clearance of pocketing;Multiple dry swallows after each bite/sip;Follow solids with liquid Postural Changes and/or Swallow Maneuvers: Out of bed for meals;Seated upright 90 degrees;Upright 30-60 min after meal (Reflux precautions)                General recommendations:  (Dietician f/u) Oral Care Recommendations: Oral care BID;Oral care before and after PO;Staff/trained caregiver to provide oral care Follow Up Recommendations: Skilled nursing-short term rehab (<3 hours/day) (TBD) Assistance recommended at discharge: Frequent or constant Supervision/Assistance SLP Visit Diagnosis: Dysphagia, pharyngeal phase (R13.13) Plan: Continue with current plan of care             Jerilynn Som, MS, CCC-SLP Speech Language Pathologist Rehab Services; [facility] [place] [phone] (ascom) Mainor Hellmann  08/01/2022, 5:36 PM

## 2022-08-01 NOTE — Progress Notes (Signed)
NAME:  Aimee Hooper, MRN:  [id number], DOB:  [birth year], LOS: 67 ADMISSION DATE:  07/22/2022  CHIEF COMPLAINT:  severe resp distress  BRIEF SYNOPSIS 61 yo obese female with RSV pneumonia, severe COPD exacerbation, superimposed Klebsiella Pneumoniae pneumonia, SEPSIS POA  History of Present Illness:  61 y.o. female history of COPD and asthma not on home O2 per patient report who was diagnosed with RSV yesterday who presents to the emergency department in severe  respiratory distress.  extensive Wheezing, shortness of breath starting this morning.   Severe increased WOB and using accessory muscles to breathe   ER Course EMS personnel who report giving 125 IV Solu-Medrol, IV magnesium, 3 DuoNebs while in route to the emergency department. WBC 28 HBG 13 TROP 11  Placed on BiPAP.  PCCM asked to admit for further workup and treatment.   Please see "significant hospital events" section below for full detailed hospital course.    Pertinent  Medical History  Stage 3 Severe COPD Asthma  Obesity  Emphysema of Lung   Significant Hospital Events: Including procedures, antibiotic start and stop dates in addition to other pertinent events   11/15: Admitted to ICU for severe COPD exacerbation, on BiPAP 11/16: Pt remains on Bipap @40 % requiring precedex gtt and prn ativan due to intermittent anxiety/agitation.  Ultimately required INTUBATION & MECHANICAL VENTILATION 11/17:  Requiring low dose peripheral Levophed due to sedation.  Lung sounds remain diminished with slight wheeze, will rest on vent today with no SBT. 11/18: Slight improvement in air entry bilaterally.  Still with evidence of bronchoconstriction as Peak pressures in mid 30's, exercised in PSV for about 30 minutes, then having episodes of apnea, so placed back on full support. Leukocytosis slowly improving.  Tracheal aspirate still pending 11/19: Increased Peak pressures up to 50, tried in PC but did not help, therefore  placed back in [facility] with reduced TV's. Diuresed, multiple albuterol nebs and Magnesium given.  Tracheal aspirate with Klebsiella, ABX changed to Levaquin. 11/20: Pt with metabolic alkalosis, holding lasix. [id number]: Metabolic alkalosis slowly improving plans for WUA and SBT today  11/22:  WUA and SBT as tolerated. 11/23: Extubated to Bipap  11/25: Tolerating 3L O2 via nasal canula.  Will transfer to the telemetry unit and transfer service to [place]:  Resp Panel by RT-PCR (Flu A&B, Covid) 11/15>>negative  MRSA PCR 11/15>>negative  Blood x2 11/15>> no growth to date Tracheal aspirate 11/16>>Klebsiella Pneumoniae (resistant to Ampicillin)  Antimicrobials:   Antibiotics Given (last 72 hours)     Date/Time Action Medication Dose Rate   07/30/22 1147 New Bag/Given   piperacillin-tazobactam (ZOSYN) IVPB 3.375 g 3.375 g 12.5 mL/hr   07/30/22 2120 New Bag/Given   piperacillin-tazobactam (ZOSYN) IVPB 3.375 g 3.375 g 12.5 mL/hr   07/31/22 0500 New Bag/Given   piperacillin-tazobactam (ZOSYN) IVPB 3.375 g 3.375 g 12.5 mL/hr   07/31/22 1406 New Bag/Given   piperacillin-tazobactam (ZOSYN) IVPB 3.375 g 3.375 g 12.5 mL/hr   07/31/22 2100 New Bag/Given   piperacillin-tazobactam (ZOSYN) IVPB 3.375 g 3.375 g 12.5 mL/hr   08/01/22 0543 New Bag/Given   piperacillin-tazobactam (ZOSYN) IVPB 3.375 g 3.375 g 12.5 mL/hr      Subjective:   No acute events overnight tolerating 3L O2 via nasal canula.  Working with PT tolerating well   Objective   Blood pressure 131/78, pulse 79, temperature 98.4 F (36.9 C), temperature source Oral, resp. rate (!) 22, height 5' (1.524 m), weight 83 kg, SpO2 96 %.  Intake/Output Summary (Last 24 hours) at 08/01/2022 1057 Last data filed at 08/01/2022 1015 Gross per 24 hour  Intake 610.25 ml  Output 2900 ml  Net -2289.75 ml   Filed Weights   07/30/22 0453 07/31/22 0456 08/01/22 0500  Weight: 88.1 kg 82.7 kg 83 kg    Labs/imaging that I have  personally reviewed  (right click and "Reselect all SmartList Selections" daily)   Examination: General: Acute on chronically ill-appearing female , NAD on 3L O2 via nasal canula   HENT: Atraumatic, normocephalic, neck supple, no JVD Lungs: Diminished throughout, even, non labored Cardiovascular: NSR, rrr, no r/g, 2+ radial/2+ distal pulses, no edema  Abdomen: +BS x4, soft, obese, non distended  Extremities: Normal bulk and tone  Neuro: Alert and oriented, follows commands, PERRLA  GU: Voiding clear yellow urine   ASSESSMENT AND PLAN  Acute Hypoxic Respiratory Failure in the setting of RSV pneumonia & Klebsiella Pneumoniae Pneumonia, along with severe COPD/Asthma exacerbation - Bipap qhs and during naps  - Supplemental O2 for dyspnea and/or hypoxia  - Scheduled and prn bronchodilator therapy  - Transitioned off iv solumedrol and start prednisone 20 mg daily  - ABX as above  RSV Pneumonia Superimposed Klebsiella Pneumoniae Pneumonia - Trend WBC and monitor fever curve - Trend PCT - Abx as outlined above   Hypotension, suspect related to Sedation +/- Sepsis ~ RESOLVED Echocardiogram 07/27/22: LVEF 60-65%, mild LVH, normal diastolic parameters, RV systolic function normal - Continuous cardiac monitoring - Not requiring vasopressors maintain map [id number]  Metabolic Alkalosis, suspect Contraction alkalosis in setting of diuresis~resolved  - Trend BMP  - Replace electrolytes as indicated  - Monitor UOP  - Continue to hold lasix for now   Mild anemia without obvious signs of bleeding  - Trend CBC - Monitor for s/sx of bleeding and transfuse for hgb <7  New onset blurred/diplopia  - Ophthalmology consulted appreciate input   Best practice (right click and "Reselect all SmartList Selections" daily)  Diet: Regular  DVT prophylaxis: LMWH GI prophylaxis: NA Mobility: Activity as tolerated  Code Status:  FULL Disposition: Pending transfer to telemetry unit   Updated pt regarding  current plan of care and all questions answered  Labs   CBC: Recent Labs  Lab 07/28/22 0430 07/29/22 0402 07/30/22 0443 07/31/22 0432 08/01/22 0533  WBC 17.1* 18.1* 18.2* 14.6* 14.0*  HGB 10.4* 10.1* 9.7* 8.6* 10.0*  HCT 35.1* 34.3* 32.6* 28.5* 33.4*  MCV 73.0* 72.7* 72.1* 71.4* 71.8*  PLT 207 210 210 181 [id number]    Basic Metabolic Panel: Recent Labs  Lab 07/27/22 0445 07/28/22 0430 07/29/22 0402 07/30/22 0443 07/31/22 0432 08/01/22 0533  NA 144 146* 142 142 144 141  K 3.8 3.9 3.7 3.6 3.3* 3.4*  CL 91* 98 101 105 107 105  CO2 >45* 43* 37* 33* 31 29  GLUCOSE 128* 119* 120* 143* 92 85  BUN 27* 30* 26* 24* 19 17  CREATININE 0.47 0.42* 0.38* 0.36* 0.32* 0.37*  CALCIUM 9.1 8.9 8.8* 8.9 8.7* 8.9  MG 2.4 2.4 2.1 2.0  --  2.0  PHOS 4.3 3.0 2.6 2.6  --  2.6   GFR: Estimated Creatinine Clearance: 69.6 mL/min (A) (by C-G formula based on SCr of 0.37 mg/dL (L)). Recent Labs  Lab 07/29/22 0402 07/30/22 0443 07/31/22 0432 08/01/22 0533  WBC 18.1* 18.2* 14.6* 14.0*    Liver Function Tests: No results for input(s): "AST", "ALT", "ALKPHOS", "BILITOT", "PROT", "ALBUMIN" in the last 168 hours. No results for input(s): "LIPASE", "  AMYLASE" in the last 168 hours. No results for input(s): "AMMONIA" in the last 168 hours.  ABG    Component Value Date/Time   PHART 7.45 07/28/2022 0835   PCO2ART 71 (HH) 07/28/2022 0835   PO2ART 96 07/28/2022 0835   HCO3 42.1 (H) 07/29/2022 1102   O2SAT 86.4 07/29/2022 1102     Coagulation Profile: No results for input(s): "INR", "PROTIME" in the last 168 hours.  Cardiac Enzymes: No results for input(s): "CKTOTAL", "CKMB", "CKMBINDEX", "TROPONINI" in the last 168 hours.  HbA1C: No results found for: "HGBA1C"  CBG: Recent Labs  Lab 07/31/22 1316 07/31/22 1732 07/31/22 1945 07/31/22 2326 08/01/22 0731  GLUCAP 91 95 90 85 88   Review of Symptoms:  Unable to assess, pt in intubated/sedated/critically ill  Past Medical History:  She,   has a past medical history of Asthma, Emphysema lung (HCC), Obesity, and Stage 3 severe COPD by GOLD classification (HCC).   Surgical History:  History reviewed. No pertinent surgical history.   Social History:   reports that she has been smoking cigarettes. She has never used smokeless tobacco. She reports that she does not drink alcohol and does not use drugs.   Family History:  Her family history is not on file.   Allergies No Known Allergies   Home Medications  Prior to Admission medications   Medication Sig Start Date End Date Taking? Authorizing Provider  azithromycin (ZITHROMAX) 250 MG tablet Take 250 mg by mouth daily. 08/27/20  Yes [provider]  celecoxib (CELEBREX) 200 MG capsule Take 200 mg by mouth daily. Patient not taking: Reported on 07/22/2022 06/26/20   [provider]  DIFFERIN 0.1 % cream Apply 1 application topically at bedtime. Patient not taking: Reported on 07/22/2022 06/26/20   [provider]  loratadine (CLARITIN) 10 MG tablet Take 10 mg by mouth daily. Patient not taking: Reported on 07/22/2022 06/26/20   [provider]  nicotine (NICODERM CQ - DOSED IN MG/24 HOURS) 21 mg/24hr patch Place 1 patch onto the skin daily. Patient not taking: Reported on 07/22/2022 07/24/20   [provider]  TRELEGY ELLIPTA 100-62.5-25 MCG/INH AEPB Take 1 puff by mouth daily. Patient not taking: Reported on 07/22/2022 08/06/20   [provider]       Critical Care Time: 41 minutes    Zada Girt, AGNP  Pulmonary/Critical Care Pager [phone] (please enter 7 digits) PCCM Consult Pager [phone] (please enter 7 digits)

## 2022-08-02 DIAGNOSIS — E873 Alkalosis: Secondary | ICD-10-CM | POA: Diagnosis not present

## 2022-08-02 DIAGNOSIS — J9621 Acute and chronic respiratory failure with hypoxia: Secondary | ICD-10-CM | POA: Diagnosis not present

## 2022-08-02 DIAGNOSIS — J189 Pneumonia, unspecified organism: Secondary | ICD-10-CM | POA: Diagnosis not present

## 2022-08-02 DIAGNOSIS — J9622 Acute and chronic respiratory failure with hypercapnia: Secondary | ICD-10-CM | POA: Diagnosis not present

## 2022-08-02 LAB — BASIC METABOLIC PANEL
Anion gap: 4 — ABNORMAL LOW (ref 5–15)
BUN: 16 mg/dL (ref 8–23)
CO2: 32 mmol/L (ref 22–32)
Calcium: 8.9 mg/dL (ref 8.9–10.3)
Chloride: 108 mmol/L (ref 98–111)
Creatinine, Ser: 0.45 mg/dL (ref 0.44–1.00)
GFR, Estimated: >60 mL/min (ref >60)
Glucose, Bld: 105 mg/dL — ABNORMAL HIGH (ref 70–99)
Potassium: 3.2 mmol/L — ABNORMAL LOW (ref 3.5–5.1)
Sodium: 144 mmol/L (ref 135–145)

## 2022-08-02 LAB — PHOSPHORUS: Phosphorus: 2.8 mg/dL (ref 2.5–4.6)

## 2022-08-02 LAB — GLUCOSE, CAPILLARY
Glucose-Capillary: 107 mg/dL — ABNORMAL HIGH (ref 70–99)
Glucose-Capillary: 101 mg/dL — ABNORMAL HIGH (ref 70–99)
Glucose-Capillary: 137 mg/dL — ABNORMAL HIGH (ref 70–99)
Glucose-Capillary: 119 mg/dL — ABNORMAL HIGH (ref 70–99)

## 2022-08-02 LAB — POTASSIUM: Potassium: 4.2 mmol/L (ref 3.5–5.1)

## 2022-08-02 LAB — MAGNESIUM: Magnesium: 2 mg/dL (ref 1.7–2.4)

## 2022-08-02 MED ORDER — ALTEPLASE 2 MG IJ SOLR
2.0000 mg | Freq: Once | INTRAMUSCULAR | Status: AC
  Administered 2022-08-02: 2 mg
  Filled 2022-08-02: qty 2

## 2022-08-02 MED ORDER — POTASSIUM CHLORIDE 10 MEQ/100ML IV SOLN
10.0000 meq | INTRAVENOUS | Status: DC
  Filled 2022-08-02 (×6): qty 100

## 2022-08-02 MED ORDER — POTASSIUM CHLORIDE 20 MEQ PO PACK
20.0000 meq | PACK | Freq: Once | ORAL | Status: AC
  Administered 2022-08-02: 20 meq via ORAL
  Filled 2022-08-02: qty 1

## 2022-08-02 MED ORDER — HEPARIN SODIUM (PORCINE) 5000 UNIT/ML IJ SOLN
5000.0000 [IU] | Freq: Three times a day (TID) | INTRAMUSCULAR | Status: DC
  Administered 2022-08-02 – 2022-08-04 (×6): 5000 [IU] via SUBCUTANEOUS
  Filled 2022-08-02 (×6): qty 1

## 2022-08-02 MED ORDER — AMOXICILLIN-POT CLAVULANATE 875-125 MG PO TABS
1.0000 | ORAL_TABLET | Freq: Two times a day (BID) | ORAL | Status: DC
  Administered 2022-08-02 – 2022-08-04 (×4): 1 via ORAL
  Filled 2022-08-02 (×5): qty 1

## 2022-08-02 MED ORDER — POTASSIUM CHLORIDE 20 MEQ PO PACK
40.0000 meq | PACK | Freq: Once | ORAL | Status: AC
  Administered 2022-08-02: 40 meq via ORAL
  Filled 2022-08-02: qty 2

## 2022-08-02 MED ORDER — STERILE WATER FOR INJECTION IJ SOLN
INTRAMUSCULAR | Status: AC
  Filled 2022-08-02: qty 10

## 2022-08-02 MED ORDER — ALTEPLASE 2 MG IJ SOLR: 2.0000 mg | Freq: Once | INTRAMUSCULAR | Status: DC

## 2022-08-02 MED ORDER — IPRATROPIUM-ALBUTEROL 0.5-2.5 (3) MG/3ML IN SOLN: 3.0000 mL | Freq: Four times a day (QID) | RESPIRATORY_TRACT | Status: DC | PRN

## 2022-08-02 NOTE — Progress Notes (Signed)
[facility]  - [place] [facility]   PATIENT NAME: Aimee Hooper    MR#:  [id number]  DATE OF BIRTH:  10-04-1960  SUBJECTIVE:  patient transferred out of ICU. Was admitted with acute on chronic hypoxic respiratory failure with requiring mechanical intubation. Patient extubated on 07/30/22 required BiPAP at bedtime. No family at bedside. Patient is motivated to go home. Currently getting physical therapy.    VITALS:  Blood pressure 123/67, pulse 79, temperature 99.1 F (37.3 C), temperature source Oral, resp. rate (!) 23, height 5' (1.524 m), weight 83.4 kg, SpO2 100 %.  PHYSICAL EXAMINATION:   GENERAL:  61 y.o.-year-old patient lying in the bed with no acute distress. obese. Chronically ill LUNGS: distant breath sounds bilaterally, no wheezing CARDIOVASCULAR: S1, S2 normal. No murmurs,   ABDOMEN: Soft, nontender, nondistended. Bowel sounds present.  EXTREMITIES: No  edema b/l.    NEUROLOGIC: nonfocal  patient is alert and awake SKIN: No obvious rash, lesion, or ulcer.   LABORATORY PANEL:  CBC Recent Labs  Lab 08/01/22 0533  WBC 14.0*  HGB 10.0*  HCT 33.4*  PLT 238    Chemistries  Recent Labs  Lab 08/02/22 0549  NA 144  K 3.2*  CL 108  CO2 32  GLUCOSE 105*  BUN 16  CREATININE 0.45  CALCIUM 8.9  MG 2.0    Assessment and Plan 61 y.o. female history of COPD and asthma not on home O2 per patient report who was diagnosed with RSV yesterday who presents to the emergency department in severe  respiratory distress.    Acute hypoxic respiratory failure in the setting of RSV pneumonia and Klebsiella pneumonia with severe COPD/asthma exacerbation . Tobacco abuse -- BiPAP QHS and during naps-- she will benefit from NIV/Trilogy at home -- continue oxygen -- start prednisone on 20 mg daily -- complete Augmentin for total five days  Metabolic alkalosis suspect contraction alkalosis in the setting of diuresis hypokalemia -- now improved. 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 TOC for discharge planning    TOTAL TIME TAKING CARE OF THIS PATIENT: 35 minutes.  >50% time spent on counselling and coordination of care  Note: This dictation was prepared with Dragon dictation along with smaller phrase technology. Any transcriptional errors that result from this process are unintentional.  Aimee Hooper M.D    [facility]   CC: Primary care physician; Pcp, No

## 2022-08-02 NOTE — Progress Notes (Signed)
Inpatient Rehab Admissions Coordinator:  ? ?Per therapy recommendations,  patient was screened for CIR candidacy by Mckinzy Fuller, MS, CCC-SLP. At this time, Pt. Appears to be a a potential candidate for CIR. I will place   order for rehab consult per protocol for full assessment. Please contact me any with questions. ? ?Tyreek Clabo, MS, CCC-SLP ?Rehab Admissions Coordinator  ?[phone] (celll) ?[phone] (office) ? ?

## 2022-08-02 NOTE — Progress Notes (Addendum)
Speech Language Pathology Treatment:    Patient Details Name: Aimee Hooper MRN: [id number] DOB: [birth year] Today's Date: 08/02/2022 Time: 5436-0677 SLP Time Calculation (min) (ACUTE ONLY): 12 min  Assessment / Plan / Recommendation Clinical Impression  Pt seen for diet tolerance and f/u education. Pt alert. Confusion noted. Son and ex-husband present. Pt with meal tray in front of pt. Pt upright in recliner.   Pt declined most items from meal tray stating she consumed them several days in a row. Assisted pt in ordering meal with education provided regarding appropriate menu items for Dysphagia 2 Diet. Pt observed taking single sips of ice tea from cup (~2 oz) with no overt s/sx pharyngeal dysphagia. Reviewed diet recommendations, safe swallowing strategies/aspiration precautions/reflux precautions with pt and family. All verbalized understanding/agreement.   Recommend continuation of current diet with safe swallowing strategies/aspiration precautions/reflux precautions. SLP to f/u per POC for diet tolerance and trials of upgraded textures as appropriate.    HPI HPI: Pt is a 61 y.o. female history of Tobacco abuse/use, COPD, Obesity, and Asthma not on home O2 (per patient initially, but later told TOC she was on 2.5L O2 at home) who was diagnosed with RSV then presented to the emergency department in severe  respiratory distress w extensive Wheezing, shortness of breath.  Severe increased WOB and using accessory muscles to breathe.  Post admit, pt was on BiPAP then orally intubated/extubated.   CXR: No acute cardiopulmonary disease.      SLP Plan  Continue with current plan of care      Recommendations for follow up therapy are one component of a multi-disciplinary discharge planning process, led by the attending physician.  Recommendations may be updated based on patient status, additional functional criteria and insurance authorization.    Recommendations  Diet recommendations:  Dysphagia 2 (fine chop);Thin liquid Liquids provided via: Cup;No straw Medication Administration: Crushed with puree (vs whole in puree) Supervision: Staff to assist with self feeding;Patient able to self feed;Full supervision/cueing for compensatory strategies Compensations: Minimize environmental distractions;Slow rate;Small sips/bites;Lingual sweep for clearance of pocketing;Multiple dry swallows after each bite/sip;Follow solids with liquid Postural Changes and/or Swallow Maneuvers: Out of bed for meals;Seated upright 90 degrees;Upright 30-60 min after meal (reflux precautions)                General recommendations:  (dietician f/u) Oral Care Recommendations: Oral care BID;Oral care before and after PO;Staff/trained caregiver to provide oral care Follow Up Recommendations: Skilled nursing-short term rehab (<3 hours/day) Assistance recommended at discharge: Frequent or constant Supervision/Assistance SLP Visit Diagnosis: Dysphagia, pharyngeal phase (R13.13) Plan: Continue with current plan of care          [name], M.S., CCC-SLP Speech-Language Pathologist [facility] [phone] (ASCOM)   [name]  08/02/2022, 1:58 PM

## 2022-08-02 NOTE — Progress Notes (Signed)
Physical Therapy Treatment Patient Details Name: Aimee Hooper MRN: [id number] DOB: 1961/08/09 Today's Date: 08/02/2022   History of Present Illness Pt is a 61 y/o F admitted on 07/22/22. Pt is being treated for RSV PNA, severe COPD exacerbation, superimposed kelbsiella pneumoniae PNA, and sepsis. Pt was intubated & mechanically ventilated on 07/23/22, extubated on 07/30/22. PMH: COPD, asthma, obesity, emphysema    PT Comments    Pt seen for PT tx with pt agreeable & visitors present throughout session. Pt reports she feels better & more clear today. While pt is AxOx4, pt demonstrates impaired overall awareness & safety awareness. Pt is able to complete supine>sit with HOB elevated & bed rails with min assist. Pt transferred STS x 2 from elevated EOB with RW. Once in standing pt demonstrates overall & proximal/trunk weakness with poor ability to hold self in static standing. Pt unsafe to attempt steps with +1 assist but is able to complete lateral scoot bed>drop arm recliner with mod assist with improvement in head/hips relationship & ability to scoot buttocks across with ongoing max cuing. Pt engages in BLE strengthening exercises with focus on both concentric & eccentric strengthening. Educated pt on recommendation of CIR upon d/c & pt very eager to return to PLOF. Will continue to follow pt acutely to address strengthening, balance, and transfers & gait.   Recommendations for follow up therapy are one component of a multi-disciplinary discharge planning process, led by the attending physician.  Recommendations may be updated based on patient status, additional functional criteria and insurance authorization.  Follow Up Recommendations  Acute inpatient rehab (3hours/day)     Assistance Recommended at Discharge Frequent or constant Supervision/Assistance  Patient can return home with the following Two people to help with walking and/or transfers;Two people to help with  bathing/dressing/bathroom;Help with stairs or ramp for entrance;Assistance with feeding;Assist for transportation;Assistance with cooking/housework;Direct supervision/assist for financial management   Equipment Recommendations  None recommended by PT (TBD in next venue)    Recommendations for Other Services       Precautions / Restrictions Precautions Precautions: Fall Restrictions Weight Bearing Restrictions: No     Mobility  Bed Mobility Overal bed mobility: Needs Assistance Bed Mobility: Supine to Sit     Supine to sit: Min assist, HOB elevated          Transfers Overall transfer level: Needs assistance Equipment used: Rolling walker (2 wheels) Transfers: Sit to/from Stand Sit to Stand: Mod assist          Lateral/Scoot Transfers: Mod assist General transfer comment: STS from elevated EOB with RW with cuing for safe hand placement. Bed>drop arm recliner with mod assist +1 with cuing for anterior weight shifting & pt able to assist with scooting.    Ambulation/Gait                   Stairs             Wheelchair Mobility    Modified Rankin (Stroke Patients Only)       Balance Overall balance assessment: Needs assistance Sitting-balance support: Feet supported, Bilateral upper extremity supported Sitting balance-Leahy Scale: Fair Sitting balance - Comments: close supervision static sitting   Standing balance support: Reliant on assistive device for balance, During functional activity, Bilateral upper extremity supported Standing balance-Leahy Scale: Zero                              Cognition Arousal/Alertness: Awake/alert Behavior During  Therapy: WFL for tasks assessed/performed Overall Cognitive Status: Within Functional Limits for tasks assessed Area of Impairment: Attention, Memory, Following commands, Safety/judgement, Awareness, Problem solving                       Following Commands: Follows one step  commands with increased time, Follows one step commands consistently Safety/Judgement: Decreased awareness of safety, Decreased awareness of deficits Awareness: Emergent, Anticipatory   General Comments: Decreased safety awareness & overall awareness.        Exercises General Exercises - Lower Extremity Long Arc Quad: AROM, Strengthening, Both, 10 reps, Seated Hip Flexion/Marching: AROM, Strengthening, Both, 10 reps, Seated    General Comments General comments (skin integrity, edema, etc.): Pt on 3L/min via nasal cannula with SPO2 reading as low as 70% during standing but question accuracy 2/2 poor pleth waveform, otherwise >90% with good waveform.      Pertinent Vitals/Pain Pain Assessment Pain Assessment: No/denies pain    Home Living                          Prior Function            PT Goals (current goals can now be found in the care plan section) Acute Rehab PT Goals Patient Stated Goal: get better PT Goal Formulation: With patient/family Time For Goal Achievement: 08/14/22 Potential to Achieve Goals: Good Progress towards PT goals: Progressing toward goals    Frequency    7X/week      PT Plan Current plan remains appropriate    Co-evaluation              AM-PAC PT "6 Clicks" Mobility   Outcome Measure  Help needed turning from your back to your side while in a flat bed without using bedrails?: A Lot Help needed moving from lying on your back to sitting on the side of a flat bed without using bedrails?: A Lot Help needed moving to and from a bed to a chair (including a wheelchair)?: Total Help needed standing up from a chair using your arms (e.g., wheelchair or bedside chair)?: Total Help needed to walk in hospital room?: Total Help needed climbing 3-5 steps with a railing? : Total 6 Click Score: 8    End of Session Equipment Utilized During Treatment: Gait belt;Oxygen Activity Tolerance: Patient tolerated treatment well Patient  left: in chair;with call bell/phone within reach;with family/visitor present Nurse Communication: Mobility status PT Visit Diagnosis: Difficulty in walking, not elsewhere classified (R26.2);Other abnormalities of gait and mobility (R26.89);Muscle weakness (generalized) (M62.81)     Time: 3414-4360 PT Time Calculation (min) (ACUTE ONLY): 27 min  Charges:  $Therapeutic Activity: 23-37 mins                     Aleda Grana, PT, DPT 08/02/22, 12:16 PM  Sandi Mariscal 08/02/2022, 12:13 PM

## 2022-08-02 NOTE — Progress Notes (Addendum)
PHARMACY CONSULT NOTE  Pharmacy Consult for Electrolyte Monitoring and Replacement   Recent Labs: Potassium (mmol/L)  Date Value  08/02/2022 3.2 (L)   Magnesium (mg/dL)  Date Value  93/90/3009 2.0   Calcium (mg/dL)  Date Value  23/30/0762 8.9   Albumin (g/dL)  Date Value  26/33/3545 3.4 (L)   Phosphorus (mg/dL)  Date Value  62/56/3893 2.8   Sodium (mmol/L)  Date Value  08/02/2022 144   Assessment: 61 y/o female with h/o COPD who is admitted with COPD exacerbation, RSV and CAP.   Pt sedated and ventilated. OGT removed 11/23;   Goal of Therapy:  Electrolytes within normal limits  Plan:  UOP 1.4>0.9; Scr 0.37>0.45 K 3.4>3.2: After receiving KCL IV q1h x4 doses on 11/25 Will replete with KCL PO x1 (1000); then PO (1200) All other electrolytes WNL, No electrolyte replacement warranted for today Follow-up electrolytes with AM labs tomorrow  Martyn Malay 08/02/2022 8:21 AM

## 2022-08-03 DIAGNOSIS — E669 Obesity, unspecified: Secondary | ICD-10-CM

## 2022-08-03 LAB — BASIC METABOLIC PANEL
Anion gap: 8 (ref 5–15)
BUN: 15 mg/dL (ref 8–23)
CO2: 29 mmol/L (ref 22–32)
Calcium: 9.4 mg/dL (ref 8.9–10.3)
Chloride: 106 mmol/L (ref 98–111)
Creatinine, Ser: 0.46 mg/dL (ref 0.44–1.00)
GFR, Estimated: >60 mL/min (ref >60)
Glucose, Bld: 87 mg/dL (ref 70–99)
Potassium: 3.3 mmol/L — ABNORMAL LOW (ref 3.5–5.1)
Sodium: 143 mmol/L (ref 135–145)

## 2022-08-03 MED ORDER — ADULT MULTIVITAMIN W/MINERALS CH
1.0000 | ORAL_TABLET | Freq: Every day | ORAL | Status: DC
  Administered 2022-08-04: 1 via ORAL
  Filled 2022-08-03: qty 1

## 2022-08-03 MED ORDER — POTASSIUM CHLORIDE CRYS ER 20 MEQ PO TBCR
40.0000 meq | EXTENDED_RELEASE_TABLET | Freq: Once | ORAL | Status: AC
  Administered 2022-08-03: 40 meq via ORAL
  Filled 2022-08-03: qty 2

## 2022-08-03 MED ORDER — ENSURE ENLIVE PO LIQD
237.0000 mL | Freq: Three times a day (TID) | ORAL | Status: DC
  Administered 2022-08-03: 237 mL via ORAL

## 2022-08-03 NOTE — Progress Notes (Signed)
[facility] Progress Note  Patient: Aimee Hooper    YQM:[id number]  DOA: 07/22/2022    Date of Service: the patient was seen and examined on 08/03/2022  Brief hospital course: 61 year old female with past medical history of obesity, COPD and asthma admitted on 11/15 for acute respiratory failure with hypoxia and hypercapnia secondary to RSV pneumonia admitted to the critical care service.  Patient initially placed on BiPAP, but then required mechanical intubation.  Eventually able to be extubated and transferred to the hospitalist service.  Hospital course complicated by new onset blurred vision with diplopia.  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  Eyepatch placed and outpatient follow-up.  Assessment and Plan: 61 y.o. female history of COPD and asthma not on home O2 per patient report who was diagnosed with RSV yesterday who presents to the emergency department in severe  respiratory distress.     Acute on chronic respiratory failure with hypoxia and hypercapnia in the setting of RSV pneumonia and Klebsiella pneumonia with severe COPD/asthma exacerbation . Tobacco abuse -- BiPAP QHS and during naps-- she will benefit from NIV/Trilogy at home -- continue oxygen.  Patient with longstanding history of chronic respiratory failure with related issues, going back for the past few years.  (Patient had been incomplete in follow-up and had only been recently approved for oxygen 2.5 L at night, but exacerbation had flared prior to her being able to start her oxygen.) -- start prednisone on 20 mg daily -- complete Augmentin for total five days Patient states that she no longer wants to smoke ever again.   Metabolic alkalosis suspect contraction alkalosis in the setting of diuresis hypokalemia -- now improved. -- Lasix on hold -- replace K   New onset blurred vision with diplopia -- seen  by ophthalmology-- back right sixth nerve palsy mostly ischemic model neuropathy in the setting of hypoxia and hypertension -- eyepatch -- follow-up [facility] patient in 1 month   Obesity Meets criteria BMI greater than 30   Tobacco abuse -- smoking cessation advised   Generalized weakness -- patient seen by PT recommends inpatient rehab Patient and family do not want to go to [facility].  Considering skilled nursing versus home health physical therapy, will decide by tomorrow    Body mass index is 35.22 kg/m.  Nutrition Problem: Inadequate oral intake Etiology: inability to eat (pt sedated and ventilated)     Consultants: Critical care Ophthalmology  Procedures: Mechanical intubation  Antimicrobials: IV Zosyn changed to p.o. Augmentin  Code Status: Full code   Subjective: Patient states breathing is better  Objective: Vital signs were reviewed and unremarkable. Vitals:   08/03/22 0607 08/03/22 0824  BP: (!) 115/51 (!) 94/58  Pulse: 79 89  Resp: 20 16  Temp: 98.1 F (36.7 C) 98.1 F (36.7 C)  SpO2: 100% 94%   No intake or output data in the 24 hours ending 08/03/22 1307 Filed Weights   08/01/22 0500 08/02/22 0500 08/03/22 0500  Weight: 83 kg 83.4 kg 81.8 kg   Body mass index is 35.22 kg/m.  Exam:  General: Alert and oriented x 3, no acute distress HEENT: Normocephalic, atraumatic Cardiovascular:, Mucous membranes are slightly dry regular rate and rhythm, S1-S2 Respiratory: Decreased breath sounds throughout Abdomen: Soft, nontender, nondistended, positive bowel sounds Musculoskeletal: No clubbing or cyanosis or edema Skin: No skin breaks, tears or lesions Psychiatry: Appropriate, no evidence of psychoses Neurology:  Goal deficits  Data Reviewed: Potassium 3.3  Disposition:  Status is: Inpatient Remains inpatient appropriate because:  -Deciding on discharge disposition    Anticipated discharge date: 11/28  Family Communication:  Ex-husband and son at the bedside DVT Prophylaxis: heparin injection 5,000 Units Start: 08/02/22 1500 SCDs Start: 07/22/22 0919    Author: Hollice Espy ,MD 08/03/2022 1:07 PM  To reach On-call, see care teams to locate the attending and reach out via [email or web address]. Between 7PM-7AM, please contact night-coverage If you still have difficulty reaching the attending provider, please page the [facility] (Director on Call) for [facility] on amion for assistance.

## 2022-08-03 NOTE — Progress Notes (Signed)
Occupational Therapy Treatment Patient Details Name: Aimee Hooper MRN: [id number] DOB: August 30, 1961 Today's Date: 08/03/2022   History of present illness Pt is a 61 y/o F admitted on 07/22/22. Pt is being treated for RSV PNA, severe COPD exacerbation, superimposed kelbsiella pneumoniae PNA, and sepsis. Pt was intubated & mechanically ventilated on 07/23/22, extubated on 07/30/22. PMH: COPD, asthma, obesity, emphysema   OT comments  Aimee Hooper was seen for OT/PT co-treatment on this date. Upon arrival to room pt reclined in bed, family at bed side, agreeable to tx. Pt requires extensive education on discharge recommendations, importance of mobility for functional strengthening, and toileting schedule. Attempted trial of eye patch for dizziness however pt removes and reports no change during initial ~2 min. Pt requires MIN A + RW initial sit<>stand at EOB, improves to CGA with cues on technique, +2 for safety as pt has poor eccentric control/tolerance. MOD A + RW side steps along EOB. Pt making good progress toward goals, will continue to follow POC. Discharge recommendation remains appropriate.     Recommendations for follow up therapy are one component of a multi-disciplinary discharge planning process, led by the attending physician.  Recommendations may be updated based on patient status, additional functional criteria and insurance authorization.    Follow Up Recommendations  Acute inpatient rehab (3hours/day)     Assistance Recommended at Discharge Intermittent Supervision/Assistance  Patient can return home with the following  A lot of help with walking and/or transfers;A little help with bathing/dressing/bathroom;Assistance with cooking/housework;Help with stairs or ramp for entrance   Equipment Recommendations  BSC/3in1    Recommendations for Other Services      Precautions / Restrictions Precautions Precautions: Fall Restrictions Weight Bearing Restrictions: No        Mobility Bed Mobility Overal bed mobility: Needs Assistance Bed Mobility: Supine to Sit, Sit to Supine     Supine to sit: Supervision Sit to supine: Supervision        Transfers Overall transfer level: Needs assistance Equipment used: Rolling walker (2 wheels) Transfers: Sit to/from Stand Sit to Stand: Min assist     Step pivot transfers: Min assist     General transfer comment: MOD A for side steps along EOB     Balance Overall balance assessment: Needs assistance Sitting-balance support: Feet supported, Bilateral upper extremity supported Sitting balance-Leahy Scale: Fair     Standing balance support: During functional activity, Bilateral upper extremity supported Standing balance-Leahy Scale: Poor                             ADL either performed or assessed with clinical judgement   ADL Overall ADL's : Needs assistance/impaired                                       General ADL Comments: MIN A + RW for simulated BSC t/f.     Cognition Arousal/Alertness: Awake/alert Behavior During Therapy: WFL for tasks assessed/performed Overall Cognitive Status: Within Functional Limits for tasks assessed Area of Impairment: Following commands, Safety/judgement, Problem solving                       Following Commands: Follows one step commands with increased time, Follows one step commands consistently Safety/Judgement: Decreased awareness of safety, Decreased awareness of deficits   Problem Solving: Decreased initiation, Slow processing, Difficulty sequencing, Requires verbal  cues, Requires tactile cues General Comments: pt perseverative on going home, poor insight into deficits         Pertinent Vitals/ Pain       Pain Assessment Pain Assessment: No/denies pain   Frequency  Min 4X/week        Progress Toward Goals  OT Goals(current goals can now be found in the care plan section)  Progress towards OT goals:  Progressing toward goals  Acute Rehab OT Goals Patient Stated Goal: to go home OT Goal Formulation: With patient/family Time For Goal Achievement: 08/15/22 Potential to Achieve Goals: Fair ADL Goals Pt Will Perform Grooming: with min assist;standing Pt Will Perform Lower Body Dressing: with min assist;sit to/from stand Pt Will Transfer to Toilet: with min assist;ambulating Pt Will Perform Toileting - Clothing Manipulation and hygiene: with min assist;sit to/from stand  Plan Discharge plan remains appropriate;Frequency remains appropriate    Co-evaluation    PT/OT/SLP Co-Evaluation/Treatment: Yes Reason for Co-Treatment: For patient/therapist safety;To address functional/ADL transfers PT goals addressed during session: Mobility/safety with mobility OT goals addressed during session: ADL's and self-care      AM-PAC OT "6 Clicks" Daily Activity     Outcome Measure   Help from another person eating meals?: None Help from another person taking care of personal grooming?: A Little Help from another person toileting, which includes using toliet, bedpan, or urinal?: A Lot Help from another person bathing (including washing, rinsing, drying)?: A Lot Help from another person to put on and taking off regular upper body clothing?: A Little Help from another person to put on and taking off regular lower body clothing?: A Lot 6 Click Score: 16    End of Session Equipment Utilized During Treatment: Rolling walker (2 wheels)  OT Visit Diagnosis: Unsteadiness on feet (R26.81);Repeated falls (R29.6);Muscle weakness (generalized) (M62.81)   Activity Tolerance Patient tolerated treatment well   Patient Left in chair;with call bell/phone within reach;with family/visitor present   Nurse Communication          Time: 1287-8676 OT Time Calculation (min): 38 min  Charges: OT General Charges $OT Visit: 1 Visit OT Treatments $Self Care/Home Management : 8-22 mins  Aimee Hooper, M.S.  OTR/L  08/03/22, 1:14 PM  ascom [phone]

## 2022-08-03 NOTE — Progress Notes (Signed)
SLP Cancellation Note  Patient Details Name: Aimee Hooper MRN: [id number] DOB: 10-Jul-1961   Cancelled treatment:       Reason Eval/Treat Not Completed: Patient at pt unavailable as she is working with OT and PT. Will continue efforts as appropriate.  [name], M.S., CCC-SLP Speech-Language Pathologist [facility] [phone] [name])   [name] 08/03/2022, 11:16 AM

## 2022-08-03 NOTE — Progress Notes (Signed)
Physical Therapy Treatment Patient Details Name: Aimee Hooper MRN: [id number] DOB: 04/11/1961 Today's Date: 08/03/2022   History of Present Illness Pt is a 61 y/o F admitted on 07/22/22. Pt is being treated for RSV PNA, severe COPD exacerbation, superimposed kelbsiella pneumoniae PNA, and sepsis. Pt was intubated & mechanically ventilated on 07/23/22, extubated on 07/30/22. PMH: COPD, asthma, obesity, emphysema    PT Comments    Pt is making good progress, co-treat performed with OT. Pt demonstrates improved tolerance and will begin to separate sessions. Would anticipate +2 needed for ambulation as pt demonstrates increased proximal LE weakness and poor eccentric control. All mobility performed on 2.5L of O2 with sats WNL. Poor insight into deficits and is perseverating on when she can go home. Very supportive family at bedside and offered to let pt come stay with them after completion of additional therapy. Still highly recommend CIR and family interested in facility [place] as [place] is barrier. Updated TOC. Will continue to progress as able.  Recommendations for follow up therapy are one component of a multi-disciplinary discharge planning process, led by the attending physician.  Recommendations may be updated based on patient status, additional functional criteria and insurance authorization.  Follow Up Recommendations  Acute inpatient rehab (3hours/day)     Assistance Recommended at Discharge Frequent or constant Supervision/Assistance  Patient can return home with the following Two people to help with walking and/or transfers;Two people to help with bathing/dressing/bathroom;Help with stairs or ramp for entrance;Assist for transportation   Equipment Recommendations   (TBD)    Recommendations for Other Services       Precautions / Restrictions Precautions Precautions: Fall Restrictions Weight Bearing Restrictions: No     Mobility  Bed Mobility Overal bed  mobility: Needs Assistance Bed Mobility: Supine to Sit, Sit to Supine     Supine to sit: Supervision Sit to supine: Supervision   General bed mobility comments: able to easily come to EOB, transfer back into bed and swing around and exit bed on opposite side.    Transfers Overall transfer level: Needs assistance Equipment used: Rolling walker (2 wheels) Transfers: Sit to/from Stand Sit to Stand: Min assist   Step pivot transfers: Min assist       General transfer comment: heavy education on safety/sequencing. Requires step by step commands for task. Once standing, poor standing tolerance with quick fatigue. Needs mod assist for side steps and poor eccentric control with descent back to seated surface. Able to step over to recliner with min assist +2 for safety.    Ambulation/Gait               General Gait Details: walked to Engineer, production    Modified Rankin (Stroke Patients Only)       Balance Overall balance assessment: Needs assistance Sitting-balance support: Feet supported, Bilateral upper extremity supported Sitting balance-Leahy Scale: Fair     Standing balance support: During functional activity, Bilateral upper extremity supported Standing balance-Leahy Scale: Poor                              Cognition Arousal/Alertness: Awake/alert Behavior During Therapy: WFL for tasks assessed/performed Overall Cognitive Status: Within Functional Limits for tasks assessed  General Comments: poor safety awarness and lack of insight to deficits        Exercises Other Exercises Other Exercises: while seated, attempted patching R eye to assist with visual deficits. Pt impulsive and removes patch and despite cues. Removed for safety as pt reports patch didn't help.    General Comments        Pertinent Vitals/Pain Pain Assessment Pain Assessment: No/denies  pain    Home Living                          Prior Function            PT Goals (current goals can now be found in the care plan section) Acute Rehab PT Goals Patient Stated Goal: get better PT Goal Formulation: With patient/family Time For Goal Achievement: 08/14/22 Potential to Achieve Goals: Good Progress towards PT goals: Progressing toward goals    Frequency    Min 4X/week      PT Plan Current plan remains appropriate    Co-evaluation PT/OT/SLP Co-Evaluation/Treatment: Yes Reason for Co-Treatment: For patient/therapist safety;To address functional/ADL transfers PT goals addressed during session: Mobility/safety with mobility;Balance OT goals addressed during session: ADL's and self-care      AM-PAC PT "6 Clicks" Mobility   Outcome Measure  Help needed turning from your back to your side while in a flat bed without using bedrails?: None Help needed moving from lying on your back to sitting on the side of a flat bed without using bedrails?: None Help needed moving to and from a bed to a chair (including a wheelchair)?: A Little Help needed standing up from a chair using your arms (e.g., wheelchair or bedside chair)?: A Little Help needed to walk in hospital room?: Total Help needed climbing 3-5 steps with a railing? : Total 6 Click Score: 16    End of Session Equipment Utilized During Treatment: Gait belt;Oxygen Activity Tolerance: Patient tolerated treatment well Patient left: in chair;with call bell/phone within reach;with family/visitor present Nurse Communication: Mobility status PT Visit Diagnosis: Difficulty in walking, not elsewhere classified (R26.2);Other abnormalities of gait and mobility (R26.89);Muscle weakness (generalized) (M62.81)     Time: 6270-3500 PT Time Calculation (min) (ACUTE ONLY): 38 min  Charges:  $Therapeutic Activity: 23-37 mins                     Elizabeth Palau, PT, DPT,  [facility] [phone]    Josefina Rynders 08/03/2022, 3:18 PM

## 2022-08-03 NOTE — Progress Notes (Signed)
Nutrition Follow Up Note   DOCUMENTATION CODES:   Obesity unspecified  INTERVENTION:   Ensure Enlive po TID, each supplement provides 350 kcal and 20 grams of protein.  MVI po daily   NUTRITION DIAGNOSIS:   Inadequate oral intake related to inability to eat (pt sedated and ventilated) as evidenced by NPO status.  GOAL:   Patient will meet greater than or equal to 90% of their needs -not met   MONITOR:   PO intake, Supplement acceptance, Labs, Weight trends, Skin, I & O's  ASSESSMENT:   61 y/o female with h/o COPD, chronic pain and marijuana use who is admitted with COPD exacerbation, RSV and CAP.  Pt initiated on a dysphagia 2 diet 11/25. Pt with poor appetite and oral intake in hospital; pt eating only sips and bites of meals. Pt advanced to a regular diet today. RD will add supplements and MVI to help pt meet her estimated needs. If patient continues to have poor oral intake, may need to place NGT and provide nutrition support. Pt remains at high refeed risk. Per chart, pt is up ~10lbs since admission. Plan is for SNF at discharge.   Medications reviewed and include: augmentin, heparin, MVI, prednisone  Labs reviewed: K 3.3(L) P 2.8 wnl, Mg 2.0 wnl- 11/26 Wbc- 14.0(H), Hgb 10.0(L), Hct 33.4(L), MCV 71.8(L), MCH 21.5(L), MCHC 29.9(L) Cbgs- 119, 137, 101, 107 x 48 hrs   Diet Order:   Diet Order             Diet regular Room service appropriate? Yes; Fluid consistency: Thin  Diet effective now                  EDUCATION NEEDS:   No education needs have been identified at this time  Skin:  Skin Assessment: Reviewed RN Assessment  Last BM:  11/26- TYPE 6  Height:   Ht Readings from Last 1 Encounters:  07/22/22 5' (1.524 m)    Weight:   Wt Readings from Last 1 Encounters:  08/03/22 81.8 kg    Ideal Body Weight:  45.4 kg  BMI:  Body mass index is 35.22 kg/m.  Estimated Nutritional Needs:   Kcal:  1700-2000kcal/day  Protein:   85-100g/day  Fluid:  1.4-1.6L/day  Koleen Distance MS, RD, LDN Please refer to [facility] for RD and/or RD on-call/weekend/after hours pager

## 2022-08-03 NOTE — Progress Notes (Signed)
PHARMACY CONSULT NOTE  Pharmacy Consult for Electrolyte Monitoring and Replacement   Recent Labs: Potassium (mmol/L)  Date Value  08/03/2022 3.3 (L)   Magnesium (mg/dL)  Date Value  53/66/4403 2.0   Calcium (mg/dL)  Date Value  47/42/5956 9.4   Albumin (g/dL)  Date Value  38/75/6433 3.4 (L)   Phosphorus (mg/dL)  Date Value  29/51/8841 2.8   Sodium (mmol/L)  Date Value  08/03/2022 143   Assessment: 61 y/o female with h/o COPD who is admitted with COPD exacerbation, RSV and CAP.   Pt sedated and ventilated. OGT removed 11/23;   Goal of Therapy:  Electrolytes within normal limits  Plan:  UOP 1.4>0.9>0 (no output documented); Scr 0.37>0.45>0.46 K 3.3. MD ordered repletion. All other electrolytes WNL, No further electrolyte replacement warranted for today Follow-up electrolytes with AM labs tomorrow  Selinda Eon 08/03/2022 8:44 AM

## 2022-08-03 NOTE — Progress Notes (Signed)
Speech Language Pathology Treatment:    Patient Details Name: Aimee Hooper MRN: [id number] DOB: [birth year] Today's Date: 08/03/2022 Time: 4098-1191 SLP Time Calculation (min) (ACUTE ONLY): 20 min  Assessment / Plan / Recommendation Clinical Impression  Pt seen for clinical swallowing re-evaluation (trials of upgraded textures) and f/u education. Pt alert and eager for diet upgrade. "I want a sandwich... I want bread." Son and ex-husband present. Pt upright in recliner.    Per son, pt has been consuming Captain's Wafers without difficulty despite not being in compliance with current diet recommendations.   Pt observed with saltine crackers. Despite edentulism, pt demonstrated a grossly functional oral swallow c/b mildly prolonged mastication/gumming of solid. Adequate oral clearance achieved. Pt declined further POs.   Recommend diet upgrade to regular diet with thin liquids and safe swallowing strategies/aspiration precautions/reflux precautions as outlined below.   Reviewed diet recommendations, safe swallowing strategies/aspiration precautions/reflux precautions, relationship between breathing/swallowing, s/sx pharyngeal dysphagia (not present during session), and risks/sequelae of aspiration/aspiration PNA with pt and family. All verbalized understanding/agreement. RN also made aware of results, recommendations, and SLP POC.    SLP to f/u x1 for diet tolerance.    HPI HPI: Pt is a 61 y.o. female history of Tobacco abuse/use, COPD, Obesity, and Asthma not on home O2 (per patient initially, but later told TOC she was on 2.5L O2 at home) who was diagnosed with RSV then presented to the emergency department in severe  respiratory distress w extensive Wheezing, shortness of breath.  Severe increased WOB and using accessory muscles to breathe.  Post admit, pt was on BiPAP then orally intubated/extubated.   CXR: No acute cardiopulmonary disease.      SLP Plan  Continue with current plan of  care      Recommendations for follow up therapy are one component of a multi-disciplinary discharge planning process, led by the attending physician.  Recommendations may be updated based on patient status, additional functional criteria and insurance authorization.    Recommendations  Diet recommendations: Regular;Thin liquid Liquids provided via: Cup;No straw Medication Administration: Crushed with puree Supervision: Staff to assist with self feeding;Patient able to self feed;Full supervision/cueing for compensatory strategies Compensations: Minimize environmental distractions;Slow rate;Small sips/bites;Follow solids with liquid Postural Changes and/or Swallow Maneuvers: Out of bed for meals;Seated upright 90 degrees;Upright 30-60 min after meal (reflux precautions)                Oral Care Recommendations: Oral care BID;Oral care before and after PO;Staff/trained caregiver to provide oral care Follow Up Recommendations: No SLP follow up Assistance recommended at discharge: Frequent or constant Supervision/Assistance SLP Visit Diagnosis: Dysphagia, oral phase (R13.11) Plan: Continue with current plan of care          Aimee Hooper, M.S., CCC-SLP Speech-Language Pathologist [facility] [phone] Aimee Hooper)   Aimee Hooper  08/03/2022, 12:49 PM

## 2022-08-03 NOTE — Hospital Course (Signed)
61 year old female with past medical history of obesity, COPD and asthma admitted on 11/15 for acute respiratory failure with hypoxia and hypercapnia secondary to RSV pneumonia admitted to the critical care service.  Patient initially placed on BiPAP, but then required mechanical intubation.  Eventually able to be extubated and transferred to the hospitalist service.  Hospital course complicated by new onset blurred vision with diplopia.  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  Eyepatch placed and outpatient follow-up.

## 2022-08-03 NOTE — Progress Notes (Addendum)
  Inpatient Rehabilitation Admissions Coordinator   Spoke with patient and son, Minerva Areola, by phone for rehab assessment. We discussed goals and expectations of a possible CIR admit. They would like to discuss before deciding preference for rehab. Dewey,her significant other,works during the day, so patient would need to be Mod I to return home. They may want to consider rehab closer to her home per her son.  Please call me with any questions.   Ottie Glazier, RN, MSN Rehab Admissions Coordinator [phone]

## 2022-08-04 DIAGNOSIS — Z720 Tobacco use: Secondary | ICD-10-CM

## 2022-08-04 MED ORDER — IPRATROPIUM-ALBUTEROL 0.5-2.5 (3) MG/3ML IN SOLN: 3.0000 mL | Freq: Four times a day (QID) | RESPIRATORY_TRACT | 11 refills | Status: AC | PRN

## 2022-08-04 MED ORDER — PREDNISONE 10 MG PO TABS: ORAL_TABLET | ORAL | 0 refills | Status: AC

## 2022-08-04 MED ORDER — ENSURE ENLIVE PO LIQD: 237.0000 mL | Freq: Three times a day (TID) | ORAL | 12 refills | Status: AC

## 2022-08-04 MED ORDER — POTASSIUM CHLORIDE CRYS ER 20 MEQ PO TBCR: 40.0000 meq | EXTENDED_RELEASE_TABLET | Freq: Once | ORAL | Status: DC

## 2022-08-04 MED ORDER — AMOXICILLIN-POT CLAVULANATE 875-125 MG PO TABS: 1.0000 | ORAL_TABLET | Freq: Two times a day (BID) | ORAL | 0 refills | Status: DC

## 2022-08-04 NOTE — Discharge Summary (Addendum)
Physician Discharge Summary   Patient: Aimee Hooper MRN: [id number] DOB: 1961/05/17  Admit date:     07/22/2022  Discharge date: 08/04/22  Discharge Physician: Annita Brod   PCP: Pcp, No   Recommendations at discharge:   New medication: Prednisone taper New medication: Augmentin 875 p.o. twice daily x 3 more days for total of 5 days of therapy Patient will follow-up with [facility] in 1 month Patient declined inpatient rehab or skilled nursing and will be set up with outpatient physical therapy Patient being [facility]  Discharge Diagnoses: Principal Problem:   Respiratory failure ([place]) Active Problems:   Pneumonia due to infectious organism   COPD with acute exacerbation ([place])   Chloride-responsive metabolic alkalosis  Resolved Problems:   * No resolved hospital problems. *  Hospital Course: 61 year old female with past medical history of obesity, COPD and asthma admitted on 11/15 for acute respiratory failure with hypoxia and hypercapnia secondary to RSV pneumonia admitted to the critical care service.  Patient initially placed on BiPAP, but then required mechanical intubation.  Eventually able to be extubated and transferred to the hospitalist service.  Hospital course complicated by new onset blurred vision with diplopia.  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  Eyepatch placed and outpatient follow-up.  Assessment and Plan: Severe sepsis causing acute on chronic respiratory failure with hypoxia and hypercapnia in the setting of RSV pneumonia and Klebsiella pneumonia with severe COPD/asthma exacerbation . Tobacco abuse -Patient met criteria for severe sepsis on admission secondary to tachypnea, tachycardia with pneumonia source causing hypotension and respiratory failure.  Initially intubated, and then recovered enough to be  extubated- BiPAP QHS and during naps-- she will benefit from NIV/Trilogy at home ([facility]) -- continue oxygen.  Patient with longstanding history of chronic respiratory failure with related issues, going back for the past few years.  (Patient had been incomplete in follow-up and had only been recently approved for oxygen 2.5 L at night, but exacerbation had flared prior to her being able to start her oxygen.) -- start prednisone on 20 mg daily -- complete Augmentin for total five days Patient states that she no longer wants to smoke ever again.   Metabolic alkalosis suspect contraction alkalosis in the setting of diuresis hypokalemia -- now improved. -- Lasix was held, potassium replaced.   New onset blurred vision with diplopia -- seen by ophthalmology, etiology felt to be right sixth nerve palsy mostly ischemic model neuropathy in the setting of hypoxia and hypertension -- Treat using eyepatch -- follow-up [facility] patient in 1 month   Obesity Meets criteria BMI greater than 30   Tobacco abuse -- smoking cessation advised   Generalized weakness -- patient seen by PT recommends inpatient rehab which patient declined as well as skilled nursing.  Wanted home health, but no home health agency accepted patient so being set up with outpatient physical therapy.      Consultants: Critical care Ophthalmology   Procedures: Mechanical intubation Disposition: Home Diet recommendation:  Discharge Diet Orders (From admission, onward)     Start     Ordered   08/04/22 0000  Diet - low sodium heart healthy        08/04/22 1240           Cardiac diet DISCHARGE MEDICATION: Allergies as of 08/04/2022  No Known Allergies      Medication List     TAKE these medications    albuterol 108 (90 Base) MCG/ACT inhaler Commonly known as: VENTOLIN HFA Inhale 2 puffs into the lungs every 6 (six) hours as needed for wheezing.    amoxicillin-clavulanate 875-125 MG tablet Commonly known as: AUGMENTIN Take 1 tablet by mouth 2 (two) times daily.   Breztri Aerosphere 160-9-4.8 MCG/ACT Aero Generic drug: Budeson-Glycopyrrol-Formoterol Inhale 2 puffs into the lungs 2 (two) times daily.   budesonide-formoterol 80-4.5 MCG/ACT inhaler Commonly known as: SYMBICORT Inhale 2 puffs into the lungs 2 (two) times daily.   feeding supplement Liqd Take 237 mLs by mouth 3 (three) times daily between meals.   ipratropium 0.02 % nebulizer solution Commonly known as: ATROVENT Take 0.5 mg by nebulization every 6 (six) hours as needed for wheezing.   ipratropium-albuterol 0.5-2.5 (3) MG/3ML Soln Commonly known as: DUONEB Take 3 mLs by nebulization every 6 (six) hours as needed.   montelukast 10 MG tablet Commonly known as: SINGULAIR Take 10 mg by mouth daily.   predniSONE 10 MG tablet Commonly known as: DELTASONE Take 2 tablets (20 mg total) by mouth daily with breakfast for 2 days, THEN 1 tablet (10 mg total) daily with breakfast for 2 days. Start taking on: August 05, 2022               Durable Medical Equipment  (From admission, onward)           Start     Ordered   08/04/22 1255  For home use only DME Walker rolling  Once       Question Answer Comment  Walker: With 5 Inch Wheels   Patient needs a walker to treat with the following condition COPD with acute exacerbation ([place])      08/04/22 1254   08/04/22 1254  For home use only DME 3 n 1  Once        08/04/22 1254            Follow-up Information     [place], [place]. Schedule an appointment as soon as possible for a visit in 1 month(s).   Specialty: Optometry Contact information: [address] [place] [address] [phone]                Discharge Exam: Danley Danker Weights   08/01/22 0500 08/02/22 0500 08/03/22 0500  Weight: 83 kg 83.4 kg 81.8 kg   General: Alert and oriented x 3, no acute distress Lungs:  Decreased breath sounds throughout  Condition at discharge: improving  The results of significant diagnostics from this hospitalization (including imaging, microbiology, ancillary and laboratory) are listed below for reference.   Imaging Studies: DG Chest Port 1 View  Result Date: 07/30/2022 CLINICAL DATA:  Acute respiratory failure with hypoxia and hypercarbia. EXAM: PORTABLE CHEST 1 VIEW COMPARISON:  07/28/2022 FINDINGS: Endotracheal tube tip is difficult to visualize but approximately 3.4 cm above the carina. Nasogastric tube extends into the abdomen but the tip is beyond the image. Right subclavian central venous catheter extends into the SVC and the tip is probably near the superior cavoatrial junction. Lung volumes have minimally changed. No focal airspace disease or consolidation. Heart size is within normal limits and stable. Negative for pneumothorax. IMPRESSION: 1. No acute cardiopulmonary disease. 2. Support apparatuses as described. Electronically Signed   By: Markus Daft M.D.   On: 07/30/2022 08:06   DG Abd 1 View  Result Date: 07/29/2022 CLINICAL DATA:  61 year old female enteric tube placement. EXAM: ABDOMEN - 1 VIEW COMPARISON:  07/23/2022 and earlier. FINDINGS: Portable AP semi upright view at 0502 hours. Enteric tube terminates in the stomach, side hole the level of the gastric body. Visible bowel-gas pattern not significantly changed and within normal limits. Grossly negative lung bases. No acute osseous abnormality identified. IMPRESSION: Satisfactory enteric tube placement into the stomach. Electronically Signed   By: Genevie Ann M.D.   On: 07/29/2022 05:38   DG Chest Port 1 View  Result Date: 07/28/2022 CLINICAL DATA:  Acute respiratory failure in a 61 year old female. EXAM: PORTABLE CHEST 1 VIEW COMPARISON:  July 26, 2022. FINDINGS: RIGHT-sided central venous access device terminates at the caval to atrial junction. Endotracheal tube approximately 3.4 cm from the carina.  Gastric tube in place, tip off the field of view. Side port below GE junction. EKG leads project over the chest. Lungs are clear aside from minimal LEFT lower lobe airspace disease. This is in the retrocardiac region. No pneumothorax. No gross effusion on frontal radiograph. Skeletal structures are unremarkable to the extent evaluated on limited evaluation. IMPRESSION: 1. Minimal LEFT lower lobe airspace disease, likely atelectasis. 2. Support lines and tubes as above. Interval placement of a RIGHT-sided central venous access device with tip in the area of the caval to atrial junction. 3. No pneumothorax or gross effusion. Electronically Signed   By: Zetta Bills M.D.   On: 07/28/2022 08:25   ECHOCARDIOGRAM COMPLETE  Result Date: 07/27/2022    ECHOCARDIOGRAM REPORT   Patient Name:   Lutricia Horsfall Date of Exam: 07/27/2022 Medical Rec #:  [id number]           Height:       60.0 in Accession #:    [id number]          Weight:       194.4 lb Date of Birth:  [birth year]            BSA:          1.844 m Patient Age:    8 years            BP:           132/66 mmHg Patient Gender: F                   HR:           97 bpm. Exam Location:  [facility] Procedure: 2D Echo, Cardiac Doppler and Color Doppler Indications:    CHF-acute diastolic O87.86  History:        Patient has no prior history of Echocardiogram examinations.                 COPD.  Sonographer:    Sherrie Sport Referring Phys: [id number] HARSH CHAWLA Diagnosing      Kathlyn Sacramento MD Phys:  Sonographer Comments: Suboptimal parasternal window. Image acquisition challenging due to COPD. IMPRESSIONS  1. Left ventricular ejection fraction, by estimation, is 60 to 65%. The left ventricle has normal function. The left ventricle has no regional wall motion abnormalities. There is mild left ventricular hypertrophy. Left ventricular diastolic parameters were normal.  2. Right ventricular systolic function is normal. The right ventricular size is normal. There is normal  pulmonary artery systolic pressure.  3. The mitral valve is normal in structure. No evidence of mitral valve regurgitation. No evidence of mitral stenosis.  4. The aortic valve is normal in structure. Aortic valve regurgitation is not visualized. No aortic stenosis  is present.  5. The inferior vena cava is normal in size with greater than 50% respiratory variability, suggesting right atrial pressure of 3 mmHg. FINDINGS  Left Ventricle: Left ventricular ejection fraction, by estimation, is 60 to 65%. The left ventricle has normal function. The left ventricle has no regional wall motion abnormalities. The left ventricular internal cavity size was normal in size. There is  mild left ventricular hypertrophy. Left ventricular diastolic parameters were normal. Right Ventricle: The right ventricular size is normal. No increase in right ventricular wall thickness. Right ventricular systolic function is normal. There is normal pulmonary artery systolic pressure. The tricuspid regurgitant velocity is 1.99 m/s, and  with an assumed right atrial pressure of 3 mmHg, the estimated right ventricular systolic pressure is 24.2 mmHg. Left Atrium: Left atrial size was normal in size. Right Atrium: Right atrial size was normal in size. Pericardium: There is no evidence of pericardial effusion. Mitral Valve: The mitral valve is normal in structure. No evidence of mitral valve regurgitation. No evidence of mitral valve stenosis. Tricuspid Valve: The tricuspid valve is normal in structure. Tricuspid valve regurgitation is trivial. No evidence of tricuspid stenosis. Aortic Valve: The aortic valve is normal in structure. Aortic valve regurgitation is not visualized. No aortic stenosis is present. Aortic valve mean gradient measures 5.0 mmHg. Aortic valve peak gradient measures 8.3 mmHg. Aortic valve area, by VTI measures 2.29 cm. Pulmonic Valve: The pulmonic valve was normal in structure. Pulmonic valve regurgitation is not visualized. No  evidence of pulmonic stenosis. Aorta: The aortic root is normal in size and structure. Venous: The inferior vena cava is normal in size with greater than 50% respiratory variability, suggesting right atrial pressure of 3 mmHg. IAS/Shunts: No atrial level shunt detected by color flow Doppler.  LEFT VENTRICLE PLAX 2D LVIDd:         4.10 cm   Diastology LVIDs:         2.60 cm   LV e' medial:    10.90 cm/s LV PW:         1.20 cm   LV E/e' medial:  8.4 LV IVS:        1.20 cm   LV e' lateral:   12.70 cm/s LVOT diam:     2.00 cm   LV E/e' lateral: 7.2 LV SV:         56 LV SV Index:   30 LVOT Area:     3.14 cm  RIGHT VENTRICLE RV Basal diam:  3.90 cm RV Mid diam:    3.60 cm RV S prime:     18.70 cm/s TAPSE (M-mode): 2.1 cm LEFT ATRIUM             Index        RIGHT ATRIUM           Index LA diam:        3.20 cm 1.73 cm/m   RA Area:     13.70 cm LA Vol (A2C):   28.1 ml 15.24 ml/m  RA Volume:   33.70 ml  18.27 ml/m LA Vol (A4C):   21.4 ml 11.60 ml/m LA Biplane Vol: 24.7 ml 13.39 ml/m  AORTIC VALVE AV Area (Vmax):    2.38 cm AV Area (Vmean):   1.84 cm AV Area (VTI):     2.29 cm AV Vmax:           144.00 cm/s AV Vmean:          107.500 cm/s AV VTI:  0.244 m AV Peak Grad:      8.3 mmHg AV Mean Grad:      5.0 mmHg LVOT Vmax:         109.00 cm/s LVOT Vmean:        63.000 cm/s LVOT VTI:          0.178 m LVOT/AV VTI ratio: 0.73  AORTA Ao Root diam: 2.77 cm MITRAL VALVE               TRICUSPID VALVE MV Area (PHT): 4.99 cm    TR Peak grad:   15.8 mmHg MV Decel Time: 152 msec    TR Vmax:        199.00 cm/s MV E velocity: 91.70 cm/s MV A velocity: 80.20 cm/s  SHUNTS MV E/A ratio:  1.14        Systemic VTI:  0.18 m                            Systemic Diam: 2.00 cm Kathlyn Sacramento MD Electronically signed by Kathlyn Sacramento MD Signature Date/Time: 07/27/2022/11:13:32 AM    Final    DG Chest Port 1 View  Result Date: 07/26/2022 CLINICAL DATA:  Respiratory failure.  RSV. EXAM: PORTABLE CHEST 1 VIEW COMPARISON:   July 25, 2022 FINDINGS: The ETT is in good position. The NG tube terminates below today's film. No pneumothorax. The cardiomediastinal silhouette is normal. No new or focal infiltrate. Mild interstitial prominence. No other acute abnormalities. IMPRESSION: Mild interstitial prominence consistent with history of RSV. No focal infiltrate. Support apparatus as above. Electronically Signed   By: Dorise Bullion III M.D.   On: 07/26/2022 11:06   [place] EKG SITE RITE  Result Date: 07/26/2022 If Site Rite image not attached, placement could not be confirmed due to current cardiac rhythm.  DG Chest Port 1 View  Result Date: 07/25/2022 CLINICAL DATA:  [id number] with hypoxic respiratory failure. EXAM: PORTABLE CHEST 1 VIEW COMPARISON:  Portable chest 07/23/2022 FINDINGS: 4:59 a.m. ETT tip is 2.9 cm from the carina. NGT tip is not filmed but based on the position of the proximal side-hole is probably in the distal gastric body. The cardiac size is upper-normal. There is mild central vascular prominence without overt edema. Mild heterogeneous opacities in the lung bases continue to be noted, without improvement or worsening. The mid and upper lung fields remain generally clear. The sulci are sharp. Stable mediastinal configuration and bony thorax. IMPRESSION: 1. No significant change since yesterday's study. 2. Mild central vascular prominence without overt edema. 3. Mild heterogeneous opacities in the lung bases, without improvement or worsening. Electronically Signed   By: Telford Nab M.D.   On: 07/25/2022 06:59   DG Chest Port 1 View  Result Date: 07/23/2022 CLINICAL DATA:  Encounter for intubation EXAM: PORTABLE CHEST 1 VIEW COMPARISON:  Radiograph 07/23/2022 FINDINGS: Endotracheal tube overlies the distal trachea approximately 2.1 cm above the carina. Orogastric tube courses below the diaphragm, side port overlying the stomach. Unchanged cardiomediastinal silhouette. Decreased right lower lung opacities. No  new or worsening airspace disease. No pleural effusion or pneumothorax. Bones are unchanged. IMPRESSION: Endotracheal tube overlies the distal trachea approximately 2.1 cm above the carina. Decreased right lower lung opacities. No new or worsening airspace disease. Electronically Signed   By: Maurine Simmering M.D.   On: 07/23/2022 18:24   DG Abd 1 View  Result Date: 07/23/2022 CLINICAL DATA:  Encounter for orogastric tube placement EXAM: ABDOMEN -  1 VIEW COMPARISON:  None Available. FINDINGS: Orogastric tube tip and side port overlie the stomach. Nonobstructive bowel gas pattern. IMPRESSION: Nasogastric tube tip and side port overlie the stomach. Electronically Signed   By: Maurine Simmering M.D.   On: 07/23/2022 18:21   DG Chest Port 1 View  Result Date: 07/23/2022 CLINICAL DATA:  Respiratory failure EXAM: PORTABLE CHEST 1 VIEW COMPARISON:  07/22/2022, CT 09/01/2020 FINDINGS: No acute airspace disease or effusion. Mild diffuse bronchitic changes. Normal cardiac size. No pneumothorax IMPRESSION: Diffuse bronchitic changes. Decreased focal opacity at the right base compared to prior Electronically Signed   By: Donavan Foil M.D.   On: 07/23/2022 15:32   DG Chest Port 1 View  Result Date: 07/22/2022 CLINICAL DATA:  Shortness of breath.  RSV positive. EXAM: PORTABLE CHEST 1 VIEW COMPARISON:  Chest x-ray dated April 20, 2021. FINDINGS: The heart size and mediastinal contours are within normal limits. Normal pulmonary vascularity. Mild patchy opacities at the right lung base. No pleural effusion or pneumothorax. No acute osseous abnormality. IMPRESSION: 1. Right basilar pneumonia. Electronically Signed   By: Titus Dubin M.D.   On: 07/22/2022 08:19    Microbiology: Results for orders placed or performed during the hospital encounter of 07/22/22  Resp Panel by RT-PCR (Flu A&B, Covid) Anterior Nasal Swab     Status: None   Collection Time: 07/22/22  8:31 AM   Specimen: Anterior Nasal Swab  Result Value Ref  Range Status   SARS Coronavirus 2 by RT PCR NEGATIVE NEGATIVE Final    Comment: (NOTE) SARS-CoV-2 target nucleic acids are NOT DETECTED.  The SARS-CoV-2 RNA is generally detectable in upper respiratory specimens during the acute phase of infection. The lowest concentration of SARS-CoV-2 viral copies this assay can detect is 138 copies/mL. A negative result does not preclude SARS-Cov-2 infection and should not be used as the sole basis for treatment or other patient management decisions. A negative result may occur with  improper specimen collection/handling, submission of specimen other than nasopharyngeal swab, presence of viral mutation(s) within the areas targeted by this assay, and inadequate number of viral copies(<138 copies/mL). A negative result must be combined with clinical observations, patient history, and epidemiological information. The expected result is Negative.  Fact Sheet for Patients:  EntrepreneurPulse.com.au  Fact Sheet for Healthcare Providers:  IncredibleEmployment.be  This test is no t yet approved or cleared by the [place] FDA and  has been authorized for detection and/or diagnosis of SARS-CoV-2 by FDA under an Emergency Use Authorization (EUA). This EUA will remain  in effect (meaning this test can be used) for the duration of the COVID-19 declaration under Section 564(b)(1) of the Act, 21 U.S.C.section 360bbb-3(b)(1), unless the authorization is terminated  or revoked sooner.       Influenza A by PCR NEGATIVE NEGATIVE Final   Influenza B by PCR NEGATIVE NEGATIVE Final    Comment: (NOTE) The Xpert Xpress SARS-CoV-2/FLU/RSV plus assay is intended as an aid in the diagnosis of influenza from Nasopharyngeal swab specimens and should not be used as a sole basis for treatment. Nasal washings and aspirates are unacceptable for Xpert Xpress SARS-CoV-2/FLU/RSV testing.  Fact Sheet for  Patients: EntrepreneurPulse.com.au  Fact Sheet for Healthcare Providers: IncredibleEmployment.be  This test is not yet approved or cleared by the [place] FDA and has been authorized for detection and/or diagnosis of SARS-CoV-2 by FDA under an Emergency Use Authorization (EUA). This EUA will remain in effect (meaning this test can be used) for the duration  of the COVID-19 declaration under Section 564(b)(1) of the Act, 21 U.S.C. section 360bbb-3(b)(1), unless the authorization is terminated or revoked.  Performed at [facility], [address] [place], [place] [address]   Blood Culture (routine x 2)     Status: None   Collection Time: 07/22/22  9:32 AM   Specimen: BLOOD  Result Value Ref Range Status   Specimen Description BLOOD RIGHT LOWER ARM  Final   Special Requests   Final    BOTTLES DRAWN AEROBIC AND ANAEROBIC Blood Culture adequate volume   Culture   Final    NO GROWTH 5 DAYS Performed at [facility], [address], [place], [place] [address]    Report Status 07/27/2022 FINAL  Final  Blood Culture (routine x 2)     Status: None   Collection Time: 07/22/22  9:32 AM   Specimen: BLOOD  Result Value Ref Range Status   Specimen Description BLOOD RIGHT UPPER ARM  Final   Special Requests   Final    BOTTLES DRAWN AEROBIC AND ANAEROBIC Blood Culture results may not be optimal due to an inadequate volume of blood received in culture bottles   Culture   Final    NO GROWTH 5 DAYS Performed at [facility], [address], [place], [place] [address]    Report Status 07/27/2022 FINAL  Final  MRSA Next Gen by PCR, Nasal     Status: None   Collection Time: 07/22/22 10:42 AM   Specimen: Nasal Mucosa; Nasal Swab  Result Value Ref Range Status   MRSA by PCR Next Gen NOT DETECTED NOT DETECTED Final    Comment: (NOTE) The GeneXpert MRSA Assay (FDA approved for NASAL specimens only), is one component of a  comprehensive MRSA colonization surveillance program. It is not intended to diagnose MRSA infection nor to guide or monitor treatment for MRSA infections. Test performance is not FDA approved in patients less than 76 years old. Performed at [facility], [address] [place], [place] [address]   Culture, Respiratory w Gram Stain     Status: None   Collection Time: 07/23/22  6:18 PM   Specimen: Tracheal Aspirate; Respiratory  Result Value Ref Range Status   Specimen Description   Final    TRACHEAL ASPIRATE Performed at [facility], [address], [place], [place] [address]    Special Requests   Final    NONE Performed at [facility], [address] [place], [place] [address]    Gram Stain   Final    RARE WBC PRESENT, PREDOMINANTLY PMN NO ORGANISMS SEEN Performed at [facility], [place] [address] [place], [place] [address]    Culture   Final    RARE KLEBSIELLA PNEUMONIAE RARE CANDIDA TROPICALIS    Report Status 07/26/2022 FINAL  Final   Organism ID, Bacteria KLEBSIELLA PNEUMONIAE  Final      Susceptibility   Klebsiella pneumoniae - MIC*    AMPICILLIN RESISTANT Resistant     CEFAZOLIN <=4 SENSITIVE Sensitive     CEFEPIME <=0.12 SENSITIVE Sensitive     CEFTAZIDIME <=1 SENSITIVE Sensitive     CEFTRIAXONE <=0.25 SENSITIVE Sensitive     CIPROFLOXACIN <=0.25 SENSITIVE Sensitive     GENTAMICIN <=1 SENSITIVE Sensitive     IMIPENEM 0.5 SENSITIVE Sensitive     TRIMETH/SULFA <=20 SENSITIVE Sensitive     AMPICILLIN/SULBACTAM <=2 SENSITIVE Sensitive     PIP/TAZO <=4 SENSITIVE Sensitive     * RARE KLEBSIELLA  PNEUMONIAE  MRSA Next Gen by PCR, Nasal     Status: None   Collection Time: 07/30/22  9:39 AM   Specimen: Nasal Mucosa; Nasal Swab  Result Value Ref Range Status   MRSA by PCR Next Gen NOT DETECTED NOT DETECTED Final    Comment: (NOTE) The GeneXpert MRSA Assay (FDA approved for NASAL specimens only), is one component of a comprehensive  MRSA colonization surveillance program. It is not intended to diagnose MRSA infection nor to guide or monitor treatment for MRSA infections. Test performance is not FDA approved in patients less than 70 years old. Performed at [facility], [address] [place], [place] [address]     Labs: CBC: Recent Labs  Lab 07/29/22 0402 07/30/22 0443 07/31/22 0432 08/01/22 0533  WBC 18.1* 18.2* 14.6* 14.0*  HGB 10.1* 9.7* 8.6* 10.0*  HCT 34.3* 32.6* 28.5* 33.4*  MCV 72.7* 72.1* 71.4* 71.8*  PLT 210 210 181 696   Basic Metabolic Panel: Recent Labs  Lab 07/29/22 0402 07/30/22 0443 07/31/22 0432 08/01/22 0533 08/02/22 0549 08/02/22 1636 08/03/22 0447  NA 142 142 144 141 144  --  143  K 3.7 3.6 3.3* 3.4* 3.2* 4.2 3.3*  CL 101 105 107 105 108  --  106  CO2 37* 33* 31 29 32  --  29  GLUCOSE 120* 143* 92 85 105*  --  87  BUN 26* 24* _0 --  15  CREATININE 0.38* 0.36* 0.32* 0.37* 0.45  --  0.46  CALCIUM 8.8* 8.9 8.7* 8.9 8.9  --  9.4  MG 2.1 2.0  --  2.0 2.0  --   --   PHOS 2.6 2.6  --  2.6 2.8  --   --    Liver Function Tests: No results for input(s): "AST", "ALT", "ALKPHOS", "BILITOT", "PROT", "ALBUMIN" in the last 168 hours. CBG: Recent Labs  Lab 08/01/22 2333 08/02/22 0331 08/02/22 0740 08/02/22 1127 08/02/22 1605  GLUCAP 96 107* 101* 137* 119*    Discharge time spent: greater than 30 minutes.  Signed: Annita Brod, MD [facility] 08/04/2022
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
## 2022-08-04 NOTE — Progress Notes (Signed)
Speech Language Pathology Treatment: Dysphagia  Patient Details Name: Aimee Hooper MRN: [id number] DOB: 1960/11/25 Today's Date: 08/04/2022 Time: 4696-2952 SLP Time Calculation (min) (ACUTE ONLY): 25 min  Assessment / Plan / Recommendation Clinical Impression  Pt seen for ongoing assessment of swallowing; education re: breathing/swallowing, diet consistency rec'd, and general aspiration precautions including recommendation for safer swallowing of Pills by use of a PUREE -- for all Pill swallowing. Pt has a Baseline of declined Pulmonary status at Baseline(COPD, asthma) and recent acute illness w/ oral intubation/extubation this admit. Pt is on O2 support at Baseline.    Discussed w/ pt/Family present in room a diet consistency including use of Cut meats, moistened cooked foods for ease of mashing/gumming foods d/t Edentulous status -- described that it lessens effort of mastication, lessens WOB, and aids digestion of solid foods. Pt, family members agreed. Talked w/ pt, family about reducing risks for aspiration by following general aspiration precautions to include Small sips Slowly, No straws if increased coughing noted, and Sit fully Upright w/ all oral intake. Recommended reducing distractions including Talking at meals and need for Rest Breaks w/ meals for conservation of energy d/t Pulmonary status. Also discussed food consistencies and options, preparation, and use of condiments to soften foods also. Encouragement given for ALL Pills to be swallowed using a Puree for safer swallowing.     Recommend continue current Regular diet(cut meats/foods) w/ Thin liquids via Cup; aspiration precautions; reflux precautions; Pills in a Puree for safer swallowing. ST services will sign off at this time as pt appears at her Baseline w/ oropharyngeal phase swallowing; Education completed w/ pt, family. NSG to reconsult if new needs arise while admitted.     HPI HPI: Pt is a 61 y.o. female history of  Tobacco abuse/use, COPD, Obesity, and Asthma not on home O2 (per patient initially, but later told TOC she was on 2.5L O2 at home) who was diagnosed with RSV then presented to the emergency department in severe  respiratory distress w extensive Wheezing, shortness of breath.  Severe increased WOB and using accessory muscles to breathe.  Post admit, pt was on BiPAP then orally intubated/extubated.   CXR: No acute cardiopulmonary disease.      SLP Plan  All goals met      Recommendations for follow up therapy are one component of a multi-disciplinary discharge planning process, led by the attending physician.  Recommendations may be updated based on patient status, additional functional criteria and insurance authorization.    Recommendations  Diet recommendations: Regular;Thin liquid Liquids provided via: Cup;No straw Medication Administration: Whole meds with puree (for safer swallowing) Supervision: Patient able to self feed;Intermittent supervision to cue for compensatory strategies (setup support) Compensations: Minimize environmental distractions;Slow rate;Small sips/bites;Follow solids with liquid (Rest Breaks to lessen any WOB) Postural Changes and/or Swallow Maneuvers: Out of bed for meals;Seated upright 90 degrees;Upright 30-60 min after meal                General recommendations:  (Dietician f/u) Oral Care Recommendations: Oral care BID;Oral care before and after PO;Patient independent with oral care Follow Up Recommendations: No SLP follow up Assistance recommended at discharge: Set up Supervision/Assistance SLP Visit Diagnosis: Dysphagia, unspecified (R13.10) Plan: All goals met             Aimee Hooper, Aimee Hooper, Aimee Hooper; Aimee Hooper [phone] (ascom) Aimee Hooper  08/04/2022, 3:22 PM

## 2022-08-04 NOTE — TOC Transition Note (Signed)
Transition of Care [facility]) - CM/SW Discharge Note   Patient Details  Name: Kirstan Fentress MRN: [id number] Date of Birth: [birth year]  Transition of Care [facility]) CM/SW Contact:  Margarito Liner, LCSW Phone Number: 08/04/2022, 2:45 PM   Clinical Narrative:   Patient has orders to discharge home today. Unable to secure home health. Faxed outpatient PT and OT referral form to [facility]. CSW working on getting NIV through [facility] since I have been unable to reach anyone at [facility] Patient is aware. Gave [facility] patient's pulmonologist information if needed. Patient confirmed numerous times that she does not want to stay in the hospital to wait on NIV approval. No further concerns. CSW signing off.  Final next level of care: OP Rehab Barriers to Discharge: Barriers Resolved   Patient Goals and CMS Choice Patient states their goals for this hospitalization and ongoing recovery are:: to walk out of here   Choice offered to / list presented to : Patient  Discharge Placement                Patient to be transferred to facility by: Family Name of family member notified: Doreene Adas Patient and family notified of of transfer: 08/04/22  Discharge Plan and Services   Discharge Planning Services: CM Consult            DME Arranged: Dan Humphreys rolling, 3-N-1, NIV DME Agency: [facility] Date DME Agency Contacted: 08/04/22   Representative spoke with at DME Agency: Demetra Shiner            Social Determinants of Health (SDOH) Interventions     Readmission Risk Interventions     No data to display

## 2022-08-04 NOTE — TOC CM/SW Note (Signed)
Patient is not able to walk the distance required to go the bathroom, or he/she is unable to safely negotiate stairs required to access the bathroom.  A 3in1 BSC will alleviate this problem  

## 2022-08-04 NOTE — Progress Notes (Signed)
Occupational Therapy Treatment Patient Details Name: Aimee Hooper MRN: [id number] DOB: 07/17/1961 Today's Date: 08/04/2022   History of present illness Pt is a 61 y/o F admitted on 07/22/22. Pt is being treated for RSV PNA, severe COPD exacerbation, superimposed kelbsiella pneumoniae PNA, and sepsis. Pt was intubated & mechanically ventilated on 07/23/22, extubated on 07/30/22. PMH: COPD, asthma, obesity, emphysema   OT comments  Ms Aimee Hooper was seen for OT treatment on this date. Upon arrival to room pt long sitting in bed, family at bed side. Pt reports increased breathing difficulties however agreeable to tx. Pt requires MIN A sit<>stand, assist for eccentric control. CGA + RW bed>chair functional mobility ~10 ft, SpO2 86% on 2.5L [place], resolves to 91% in sitting. MIN A don underwear, assist standing balance. IS and acapella provided and exercises reviewed with good return demonstration. Pt making good progress toward goals, will continue to follow POC. Discharge recommendation remains appropriate.     Recommendations for follow up therapy are one component of a multi-disciplinary discharge planning process, led by the attending physician.  Recommendations may be updated based on patient status, additional functional criteria and insurance authorization.    Follow Up Recommendations  Acute inpatient rehab (3hours/day)     Assistance Recommended at Discharge Intermittent Supervision/Assistance  Patient can return home with the following  A lot of help with walking and/or transfers;A little help with bathing/dressing/bathroom;Assistance with cooking/housework;Help with stairs or ramp for entrance   Equipment Recommendations  BSC/3in1    Recommendations for Other Services      Precautions / Restrictions Precautions Precautions: Fall Restrictions Weight Bearing Restrictions: No       Mobility Bed Mobility Overal bed mobility: Needs Assistance Bed Mobility: Supine to Sit      Supine to sit: Supervision          Transfers Overall transfer level: Needs assistance Equipment used: Rolling walker (2 wheels) Transfers: Sit to/from Stand Sit to Stand: Min assist           General transfer comment: assist for eccentric control     Balance Overall balance assessment: Needs assistance Sitting-balance support: No upper extremity supported, Feet supported Sitting balance-Leahy Scale: Good     Standing balance support: No upper extremity supported, During functional activity Standing balance-Leahy Scale: Fair                             ADL either performed or assessed with clinical judgement   ADL Overall ADL's : Needs assistance/impaired                                       General ADL Comments: CGA + RW for simulated BSC t/f ~10 ft, SpO2 86% on 2.5L [place]. MIN A don underwear, assist standing balance      Cognition Arousal/Alertness: Awake/alert Behavior During Therapy: WFL for tasks assessed/performed Overall Cognitive Status: Within Functional Limits for tasks assessed                                          Exercises Other Exercises Other Exercises: reviewed IS and acapella breathing exercises            Pertinent Vitals/ Pain       Pain Assessment Pain Assessment: No/denies pain  Frequency  Min 4X/week        Progress Toward Goals  OT Goals(current goals can now be found in the care plan section)  Progress towards OT goals: Progressing toward goals  Acute Rehab OT Goals Patient Stated Goal: to go home OT Goal Formulation: With patient/family Time For Goal Achievement: 08/15/22 Potential to Achieve Goals: Fair ADL Goals Pt Will Perform Grooming: with min assist;standing Pt Will Perform Lower Body Dressing: with min assist;sit to/from stand Pt Will Transfer to Toilet: with min assist;ambulating Pt Will Perform Toileting - Clothing Manipulation and hygiene: with min  assist;sit to/from stand  Plan Discharge plan remains appropriate;Frequency remains appropriate    Co-evaluation                 AM-PAC OT "6 Clicks" Daily Activity     Outcome Measure   Help from another person eating meals?: None Help from another person taking care of personal grooming?: A Little Help from another person toileting, which includes using toliet, bedpan, or urinal?: A Lot Help from another person bathing (including washing, rinsing, drying)?: A Lot Help from another person to put on and taking off regular upper body clothing?: A Little Help from another person to put on and taking off regular lower body clothing?: A Lot 6 Click Score: 16    End of Session Equipment Utilized During Treatment: Rolling walker (2 wheels)  OT Visit Diagnosis: Unsteadiness on feet (R26.81);Repeated falls (R29.6);Muscle weakness (generalized) (M62.81)   Activity Tolerance Patient tolerated treatment well   Patient Left in chair;with call bell/phone within reach;with family/visitor present   Nurse Communication          Time: 0100-7121 OT Time Calculation (min): 23 min  Charges: OT General Charges $OT Visit: 1 Visit OT Treatments $Self Care/Home Management : 23-37 mins  Kathie Dike, M.S. OTR/L  08/04/22, 12:46 PM  ascom [phone]

## 2022-08-04 NOTE — Progress Notes (Signed)
Patient refused BIPAP, states that she would just like the [place]. NARD/SOB noted at this time.

## 2022-08-04 NOTE — Progress Notes (Signed)
PHARMACY CONSULT NOTE  Pharmacy Consult for Electrolyte Monitoring and Replacement   Recent Labs: Potassium (mmol/L)  Date Value  08/03/2022 3.3 (L)   Magnesium (mg/dL)  Date Value  97/10/6376 2.0   Calcium (mg/dL)  Date Value  58/85/0277 9.4   Albumin (g/dL)  Date Value  41/28/7867 3.4 (L)   Phosphorus (mg/dL)  Date Value  67/20/9470 2.8   Sodium (mmol/L)  Date Value  08/03/2022 143   Assessment: 61 y/o female with h/o COPD who is admitted with COPD exacerbation, RSV and CAP.   Pt sedated and ventilated. OGT removed 11/23;   Goal of Therapy:  Electrolytes within normal limits  Plan:  UOP 1.4>0.9>0 (no output documented); Scr 0.37>0.45>0.46 K 3.3. Give Kcl PO x1 for repletion. All other electrolytes WNL, No further electrolyte replacement warranted for today Follow-up electrolytes with AM labs tomorrow  Aimee Hooper 08/04/2022 7:53 AM

## 2022-08-04 NOTE — Progress Notes (Signed)
Pt has severe chronic respiratory failure due to severe COPD. Pt requires frequent durations of respiratory support and deteriorates quickly in the absence of non-invasive mechanical ventilator. BIPAP has been considered but has been ruled-out and insufficient. Pt's PC02 was 71 on 07/28/2022. Interruption or failure to provide NIMV would quickly lead to exacerbation of the patient's condition, lead to hospitalization and likely harm the patient. Continued use of the NIMV is preferred. Patient can clear secretions and protect their airway without ventilator there is significant risk of decline in health status with worsening of condition and increased risk of CO2 retention with untimely readmissions to the hospital. Based on these concerns, we are working with the durable medical equipment company to look into initiation of noninvasive home ventilation.

## 2022-08-04 NOTE — Progress Notes (Signed)
Inpatient Rehabilitation Admissions Coordinator   Noted [facility] referrals to [facility] and [facility] per family preference for AIR level rehabs. I will follow at a distance.  Ottie Glazier, RN, MSN Rehab Admissions Coordinator [phone] 08/04/2022 9:25 AM

## 2022-08-04 NOTE — NC FL2 (Signed)
[place] LEVEL OF CARE SCREENING TOOL     IDENTIFICATION  Patient Name: Aimee Hooper Birthdate: 1961/07/04 Sex: female Admission Date (Current Location): 07/22/2022  [facility] and [place] Number:  Engineering geologist and Address:  [facility], [address] [place], [place] [address]      Provider Number: [id number]  Attending Physician Name and Address:  Annita Brod, MD  Relative Name and Phone Number:       Current Level of Care: Hospital Recommended Level of Care: [place] Prior Approval Number:    Date Approved/Denied:   PASRR Number: [id number]  Discharge Plan: SNF    Current Diagnoses: Patient Active Problem List   Diagnosis Date Noted   Chloride-responsive metabolic alkalosis [id number]   COPD with acute exacerbation ([place]) 07/28/2022   Pneumonia due to infectious organism 07/23/2022   Respiratory failure ([place]) 07/22/2022   Pneumonia due to COVID-19 virus 09/01/2020   Acute respiratory failure due to COVID-19 [facility]) 09/01/2020   Stage 3 severe COPD by GOLD classification ([place]) 11/25/2016    Orientation RESPIRATION BLADDER Height & Weight     Self, Time, Situation, Place  O2, Other (Comment) (Nasal Cannula 2.5 L. Bipap QHS 10/5 at 30%.) Continent Weight: 180 lb 5.4 oz (81.8 kg) Height:  5' (152.4 cm)  BEHAVIORAL SYMPTOMS/MOOD NEUROLOGICAL BOWEL NUTRITION STATUS     (None) Continent Diet (Regular)  AMBULATORY STATUS COMMUNICATION OF NEEDS Skin   Limited Assist Verbally Skin abrasions, Bruising                       Personal Care Assistance Level of Assistance  Bathing, Feeding, Dressing Bathing Assistance: Limited assistance Feeding assistance: Independent Dressing Assistance: Limited assistance     Functional Limitations Info  Sight, Hearing, Speech Sight Info: Adequate Hearing Info: Adequate Speech Info: Adequate    SPECIAL CARE FACTORS FREQUENCY  PT (By licensed PT), OT  (By licensed OT)     PT Frequency: 5 x week OT Frequency: 5 x week            Contractures Contractures Info: Not present    Additional Factors Info  Code Status, Allergies Code Status Info: Full code Allergies Info: NKDA           Current Medications (08/04/2022):  This is the current hospital active medication list Current Facility-Administered Medications  Medication Dose Route Frequency Provider Last Rate Last Admin   amoxicillin-clavulanate (AUGMENTIN) 875-125 MG per tablet 1 tablet  1 tablet Oral Q12H Fritzi Mandes, MD   1 tablet at 08/04/22 0825   feeding supplement (ENSURE ENLIVE / ENSURE PLUS) liquid 237 mL  237 mL Oral TID BM Annita Brod, MD   237 mL at 08/03/22 2048   heparin injection 5,000 Units  5,000 Units Subcutaneous Q8H Fritzi Mandes, MD   5,000 Units at 08/04/22 0610   ipratropium-albuterol (DUONEB) 0.5-2.5 (3) MG/3ML nebulizer solution 3 mL  3 mL Nebulization Q6H PRN Fritzi Mandes, MD       multivitamin with minerals tablet 1 tablet  1 tablet Oral Daily Annita Brod, MD   1 tablet at 08/04/22 0825   ondansetron (ZOFRAN) injection 4 mg  4 mg Intravenous Q6H PRN Flora Lipps, MD       Oral care mouth rinse  15 mL Mouth Rinse PRN Armando Reichert, MD       polyvinyl alcohol (LIQUIFILM TEARS) 1.4 % ophthalmic solution 1 drop  1 drop Both Eyes PRN  Rust-[place], Cecelia Byars, NP   1 drop at 08/01/22 0817   predniSONE (DELTASONE) tablet 20 mg  20 mg Oral Q breakfast Martyn Malay, RPH   20 mg at 08/04/22 0825   sodium chloride flush (NS) 0.9 % injection 10-40 mL  10-40 mL Intracatheter Q12H Chawla, Harsh, MD   10 mL at 08/04/22 0826   sodium chloride flush (NS) 0.9 % injection 10-40 mL  10-40 mL Intracatheter PRN Dorene Grebe, Harsh, MD       traZODone (DESYREL) tablet 25 mg  25 mg Oral QHS PRN Ezequiel Essex, NP   25 mg at 08/01/22 2015     Discharge Medications: Please see discharge summary for a list of discharge medications.  Relevant Imaging  Results:  Relevant Lab Results:   Additional Information SS#: [id number]  Margarito Liner, LCSW

## 2022-08-04 NOTE — TOC CM/SW Note (Signed)
[facility] REFERRAL        Occupational Therapy * Physical Therapy * Speech Therapy                           DATE _11/28/2023_       PATIENT NAME_[name]_      PATIENT [id number]__       DIAGNOSIS/DIAGNOSIS CODE _J96.90, J18.9, J44.1, E98.3_     DATE OF DISCHARGE: _11/28/2023_       PRIMARY CARE PHYSICIAN [facility]    PCP PHONE/FAX_[phone]      Dear Provider (Name: [facility]_  Fax: _[phone]_):   I certify that I have examined this patient and that occupational/physical/speech therapy is necessary on an outpatient basis.    The patient has expressed interest in completing their recommended course of therapy at your  location.  Once a formal order from the patient's primary care physician has been obtained, please  contact him/her to schedule an appointment for evaluation at your earliest convenience.   [ x ]  Physical Therapy Evaluate and Treat          [ x ]  Occupational Therapy Evaluate and Treat                                                                    [  ]  Speech Therapy Evaluate and Treat         The patient's primary care physician (listed above) must furnish and be responsible for a formal order such that the recommended services may be furnished while under the primary physician's care, and that the plan of care will be established and reviewed every 30 days (or more often if condition necessitates).

## 2023-09-30 ENCOUNTER — Emergency Department: Payer: Medicaid Other

## 2023-09-30 ENCOUNTER — Emergency Department
Admission: EM | Admit: 2023-09-30 | Discharge: 2023-10-01 | Disposition: A | Discharge status: Home / Self Care | Payer: Medicaid Other | Attending: Emergency Medicine | Admitting: Emergency Medicine

## 2023-09-30 ENCOUNTER — Other Ambulatory Visit: Payer: Self-pay

## 2023-09-30 DIAGNOSIS — K5900 Constipation, unspecified: Secondary | ICD-10-CM | POA: Diagnosis not present

## 2023-09-30 DIAGNOSIS — J449 Chronic obstructive pulmonary disease, unspecified: Secondary | ICD-10-CM | POA: Insufficient documentation

## 2023-09-30 DIAGNOSIS — R0789 Other chest pain: Secondary | ICD-10-CM | POA: Insufficient documentation

## 2023-09-30 DIAGNOSIS — M549 Dorsalgia, unspecified: Secondary | ICD-10-CM | POA: Insufficient documentation

## 2023-09-30 DIAGNOSIS — Z20822 Contact with and (suspected) exposure to covid-19: Secondary | ICD-10-CM | POA: Insufficient documentation

## 2023-09-30 NOTE — ED Provider Notes (Signed)
[facility] Provider Note    Event Date/Time   First MD Initiated Contact with Patient 09/30/23 2321     (approximate)   History   Generalized Body Aches (PATIENT HAS BEEN FEELING BAD FOR A WEEK OR SO, SUPPOSED TO BE ON O2 @ HOME C/O CP - TOOK ASA 325 @ HOME - EMS FOUND TO HAVE WHEEZING - CONTINUES TO SMOKE - GIVEN DUONEB X 2 VIA EMS)   HPI Aimee Hooper is a 63 y.o. female with history of COPD on supplemental oxygen at home generally between 3.5 and 4 L.  She presents for evaluation of gradually feeling worse in general over 3 to 4 days.  Over the last 1 to 2 days she has developed some chest pain that she describes as sharp and aching as well as some left-sided back pain but she is not sure if that is because of the position she has been lying around in.  She feels generally weak and is having a hard time getting up out of the chair.  She denies having a fever and said that her breathing is no worse than usual.  She has had no nausea, vomiting, abdominal pain, nor diarrhea.  She has no history of heart disease/ACS and does not typically have chest pain.  She reports that she was extremely ill about a year ago and required intubation and an extended stay in the hospital due to sepsis and pneumonia.     Physical Exam      Most recent vital signs: Vitals:   10/01/23 0300 10/01/23 0330  BP: 111/67 122/64  Pulse: 93 94  Resp:    Temp:    SpO2: 99% 97%    General: Awake, appears chronically ill and in some mild distress currently, obviously anxious as well. CV:  Good peripheral perfusion.  Borderline tachycardia, normal heart sounds.  Regular rhythm. Resp:  Patient is using her usual supplementary oxygen.  She has some mild tachypnea and seems to be having some pursed lip breathing, but this is apparently normal for her and she is not reporting feeling short of breath currently.  Her lungs are clear to auscultation with no wheezing although she did  receive a nebulizer treatment and route to the hospital. Abd:  No distention.  Obese, no tenderness to palpation including in the epigastrium and right upper quadrant.  No guarding or rebound. Other:  Normal mentation.   ED Results / Procedures / Treatments   Labs (all labs ordered are listed, but only abnormal results are displayed) Labs Reviewed  COMPREHENSIVE METABOLIC PANEL - Abnormal; Notable for the following components:      Result Value   Glucose, Bld 109 (*)    All other components within normal limits  CBC WITH DIFFERENTIAL/PLATELET - Abnormal; Notable for the following components:   WBC 17.4 (*)    RBC 5.26 (*)    Hemoglobin 11.0 (*)    MCV 71.7 (*)    MCH 20.9 (*)    MCHC 29.2 (*)    RDW 15.9 (*)    Neutro Abs 9.0 (*)    Lymphs Abs 6.4 (*)    Eosinophils Absolute 0.8 (*)    All other components within normal limits  RESP PANEL BY RT-PCR (RSV, FLU A&B, COVID)  RVPGX2  CULTURE, BLOOD (SINGLE)  LACTIC ACID, PLASMA  LACTIC ACID, PLASMA  LIPASE, BLOOD  URINALYSIS, W/ REFLEX TO CULTURE (INFECTION SUSPECTED)  TROPONIN I (HIGH SENSITIVITY)  TROPONIN I (HIGH SENSITIVITY)  EKG  ED ECG REPORT I, Loleta Rose, the attending physician, personally viewed and interpreted this ECG.  Date: 09/30/2023 EKG Time: 23: 33 Rate: 98 Rhythm: normal sinus rhythm QRS Axis: normal Intervals: normal ST/T Wave abnormalities: Non-specific ST segment / T-wave changes, but no clear evidence of acute ischemia. Narrative Interpretation: no definitive evidence of acute ischemia; does not meet STEMI criteria.    RADIOLOGY I viewed and interpreted the patient's chest x-ray, see hospital course for details   PROCEDURES:  Critical Care performed: No  .1-3 Lead EKG Interpretation  Performed by: Loleta Rose, MD Authorized by: Loleta Rose, MD     Interpretation: normal     ECG rate:  95   ECG rate assessment: normal     Rhythm: sinus rhythm     Ectopy: none      Conduction: normal       IMPRESSION / MDM / ASSESSMENT AND PLAN / ED COURSE  I reviewed the triage vital signs and the nursing notes.                              Differential diagnosis includes, but is not limited to, viral illness, community-acquired pneumonia, ACS, PE, biliary colic, less likely AAS.  Patient's presentation is most consistent with acute presentation with potential threat to life or bodily function.  Labs/studies ordered: EKG, chest x-ray, CT chest without contrast, CBC with differential, respiratory viral panel, troponin, CMP, lactic acid, urinalysis, lipase, blood culture for possible sepsis.  Interventions/Medications given:  Medications  ketorolac (TORADOL) 30 MG/ML injection 15 mg (15 mg Intravenous Given 10/01/23 0132)  ondansetron (ZOFRAN) injection 4 mg (4 mg Intravenous Given 10/01/23 0132)  oxyCODONE-acetaminophen (PERCOCET/ROXICET) 5-325 MG per tablet 2 tablet (2 tablets Oral Given 10/01/23 0301)    (Note:  hospital course my include additional interventions and/or labs/studies not listed above.)   Patient in mild distress but difficult to appreciate if this is her baseline or contributed to tonight by anxiety over her symptoms.  She is very worried about how ill she was a year ago and does not want to be that ill again.  I am initiating a "possible sepsis" workup but her vital signs are generally reassuring.  I still feel that viral illness and/or COPD exacerbation is the most probable diagnosis but we will assess further.  She agrees with the plan.  No abdominal tenderness to palpation which is reassuring.  The patient is on the cardiac monitor to evaluate for evidence of arrhythmia and/or significant heart rate changes.   Clinical Course as of 10/01/23 0344  Fri Oct 01, 2023  0053 [facility] Chest [place] [address] I viewed and interpreted the patient's 1 view chest x-ray.  There is some haziness that is difficult to be sure if it represents infection or fluid.  I  am ordering a CT chest without contrast for further evaluation given that this may substantially change the patient's treatment and/or disposition.  Radiologist report also confirms some abnormalities although they are nonspecific at this time. [CF]  0054 WBC(!): 17.4 Mild leukocytosis, similar to prior, unclear clinical significance [CF]  0102 Lactic Acid, Venous: 0.8 [CF]  0142 Resp panel by RT-PCR (RSV, Flu A&B, Covid) Anterior Nasal Swab Negative respiratory viral panel [CF]    Clinical Course User Index [CF] Loleta Rose, MD     FINAL CLINICAL IMPRESSION(S) / ED DIAGNOSES   Final diagnoses:  Acute back pain, unspecified back location, unspecified back pain  laterality  Constipation, unspecified constipation type  Atypical chest pain     Rx / DC Orders   ED Discharge Orders          Ordered    oxyCODONE-acetaminophen (PERCOCET) 5-325 MG tablet  Every 6 hours PRN        10/01/23 0343             Note:  This document was prepared using Dragon voice recognition software and may include unintentional dictation errors.   Loleta Rose, MD 10/01/23 [phone]

## 2023-10-01 ENCOUNTER — Encounter: Payer: Self-pay | Admitting: Emergency Medicine

## 2023-10-01 ENCOUNTER — Emergency Department: Payer: Medicaid Other

## 2023-10-01 LAB — CBC WITH DIFFERENTIAL/PLATELET
Abs Immature Granulocytes: 0.07 10*3/uL (ref 0.00–0.07)
Basophils Absolute: 0.1 10*3/uL (ref 0.0–0.1)
Basophils Relative: 1 %
Eosinophils Absolute: 0.8 10*3/uL — ABNORMAL HIGH (ref 0.0–0.5)
Eosinophils Relative: 5 %
HCT: 37.7 % (ref 36.0–46.0)
Hemoglobin: 11 g/dL — ABNORMAL LOW (ref 12.0–15.0)
Immature Granulocytes: 0 %
Lymphocytes Relative: 37 %
Lymphs Abs: 6.4 10*3/uL — ABNORMAL HIGH (ref 0.7–4.0)
MCH: 20.9 pg — ABNORMAL LOW (ref 26.0–34.0)
MCHC: 29.2 g/dL — ABNORMAL LOW (ref 30.0–36.0)
MCV: 71.7 fL — ABNORMAL LOW (ref 80.0–100.0)
Monocytes Absolute: 0.9 10*3/uL (ref 0.1–1.0)
Monocytes Relative: 5 %
Neutro Abs: 9 10*3/uL — ABNORMAL HIGH (ref 1.7–7.7)
Neutrophils Relative %: 52 %
Platelets: 369 10*3/uL (ref 150–400)
RBC: 5.26 MIL/uL — ABNORMAL HIGH (ref 3.87–5.11)
RDW: 15.9 % — ABNORMAL HIGH (ref 11.5–15.5)
WBC: 17.4 10*3/uL — ABNORMAL HIGH (ref 4.0–10.5)
nRBC: 0 % (ref 0.0–0.2)

## 2023-10-01 LAB — COMPREHENSIVE METABOLIC PANEL
ALT: 18 U/L (ref 0–44)
AST: 15 U/L (ref 15–41)
Albumin: 3.5 g/dL (ref 3.5–5.0)
Alkaline Phosphatase: 83 U/L (ref 38–126)
Anion gap: 9 (ref 5–15)
BUN: 14 mg/dL (ref 8–23)
CO2: 31 mmol/L (ref 22–32)
Calcium: 9.4 mg/dL (ref 8.9–10.3)
Chloride: 100 mmol/L (ref 98–111)
Creatinine, Ser: 0.54 mg/dL (ref 0.44–1.00)
GFR, Estimated: >60 mL/min (ref >60)
Glucose, Bld: 109 mg/dL — ABNORMAL HIGH (ref 70–99)
Potassium: 3.7 mmol/L (ref 3.5–5.1)
Sodium: 140 mmol/L (ref 135–145)
Total Bilirubin: 0.3 mg/dL (ref 0.0–1.2)
Total Protein: 7.6 g/dL (ref 6.5–8.1)

## 2023-10-01 LAB — TROPONIN I (HIGH SENSITIVITY)
Troponin I (High Sensitivity): 3 ng/L (ref <18)
Troponin I (High Sensitivity): 3 ng/L (ref <18)

## 2023-10-01 LAB — LIPASE, BLOOD: Lipase: 32 U/L (ref 11–51)

## 2023-10-01 LAB — RESP PANEL BY RT-PCR (RSV, FLU A&B, COVID)  RVPGX2
Influenza A by PCR: NEGATIVE
Influenza B by PCR: NEGATIVE
Resp Syncytial Virus by PCR: NEGATIVE
SARS Coronavirus 2 by RT PCR: NEGATIVE

## 2023-10-01 LAB — LACTIC ACID, PLASMA
Lactic Acid, Venous: 0.8 mmol/L (ref 0.5–1.9)
Lactic Acid, Venous: 0.8 mmol/L (ref 0.5–1.9)

## 2023-10-01 MED ORDER — ONDANSETRON HCL 4 MG/2ML IJ SOLN
4.0000 mg | INTRAMUSCULAR | Status: AC
  Administered 2023-10-01: 4 mg via INTRAVENOUS
  Filled 2023-10-01: qty 2

## 2023-10-01 MED ORDER — OXYCODONE-ACETAMINOPHEN 5-325 MG PO TABS
2.0000 | ORAL_TABLET | Freq: Once | ORAL | Status: AC
  Administered 2023-10-01: 2 via ORAL
  Filled 2023-10-01: qty 2

## 2023-10-01 MED ORDER — OXYCODONE-ACETAMINOPHEN 5-325 MG PO TABS: 1.0000 | ORAL_TABLET | Freq: Four times a day (QID) | ORAL | 0 refills | Status: DC | PRN

## 2023-10-01 MED ORDER — KETOROLAC TROMETHAMINE 30 MG/ML IJ SOLN
15.0000 mg | Freq: Once | INTRAMUSCULAR | Status: AC
  Administered 2023-10-01: 15 mg via INTRAVENOUS
  Filled 2023-10-01: qty 1

## 2023-10-01 NOTE — Discharge Instructions (Signed)
Your workup in the Emergency Department today was reassuring.  We did not find any specific abnormalities.  We recommend you drink plenty of fluids, take your regular medications and/or any new ones prescribed today, and follow up with the doctor(s) listed in these documents as recommended.  Return to the Emergency Department if you develop new or worsening symptoms that concern you.  For constipation, we recommend that you use one or more of the following over-the-counter medications in the order described:   1)  Miralax (powder):  This medication works by drawing additional fluid into your intestines and helps to flush out your stool.  Mix the powder with water or juice according to label instructions.  Be sure to use the recommended amount of water or juice when you mix up the powder.  Plenty of fluids will help to prevent constipation. 2)  Colace (or Dulcolax) 100 mg:  This is a stool softener, and you may take it once or twice a day as needed. 3)  Senna tablets:  This is a bowel stimulant that will help "push" out your stool. It is the next step to add after you have tried a stool softener. 4)  Magnesium citrate: This is typically sold in a clear glass bottle also purchased without the need for prescription.  You can drink the bottle and it typically stimulates your bowels within a short period of time.  You may also want to consider using glycerin suppositories, which you insert into your rectum.  You hold it in place and is dissolves and softens your stool and stimulates your bowels.  You could also consider using an enema, which is also available over the counter.  Remember that narcotic pain medications are constipating, so avoid them or minimize their use.  Drink plenty of fluids.  Please return to the Emergency Department immediately if you develop new or worsening symptoms that concern you, such as (but not limited to) fever > 101 degrees, severe abdominal pain, or persistent vomiting.

## 2023-10-08 LAB — CULTURE, BLOOD (SINGLE): Special Requests: ADEQUATE

## 2024-01-27 ENCOUNTER — Emergency Department

## 2024-01-27 ENCOUNTER — Inpatient Hospital Stay
Admission: EM | Admit: 2024-01-27 | Discharge: 2024-01-28 | DRG: 189 | Disposition: A | Discharge status: Home / Self Care | Attending: Internal Medicine | Admitting: Internal Medicine

## 2024-01-27 DIAGNOSIS — J9621 Acute and chronic respiratory failure with hypoxia: Secondary | ICD-10-CM | POA: Diagnosis present

## 2024-01-27 DIAGNOSIS — R000 Tachycardia, unspecified: Secondary | ICD-10-CM | POA: Diagnosis present

## 2024-01-27 DIAGNOSIS — R6510 Systemic inflammatory response syndrome (SIRS) of non-infectious origin without acute organ dysfunction: Secondary | ICD-10-CM | POA: Diagnosis present

## 2024-01-27 DIAGNOSIS — R040 Epistaxis: Secondary | ICD-10-CM | POA: Diagnosis present

## 2024-01-27 DIAGNOSIS — Z885 Allergy status to narcotic agent status: Secondary | ICD-10-CM

## 2024-01-27 DIAGNOSIS — R0902 Hypoxemia: Secondary | ICD-10-CM

## 2024-01-27 DIAGNOSIS — E66812 Obesity, class 2: Secondary | ICD-10-CM | POA: Diagnosis present

## 2024-01-27 DIAGNOSIS — D72829 Elevated white blood cell count, unspecified: Secondary | ICD-10-CM

## 2024-01-27 DIAGNOSIS — Z6835 Body mass index (BMI) 35.0-35.9, adult: Secondary | ICD-10-CM

## 2024-01-27 DIAGNOSIS — Z9981 Dependence on supplemental oxygen: Secondary | ICD-10-CM | POA: Diagnosis not present

## 2024-01-27 DIAGNOSIS — E66813 Obesity, class 3: Secondary | ICD-10-CM | POA: Insufficient documentation

## 2024-01-27 DIAGNOSIS — Z7951 Long term (current) use of inhaled steroids: Secondary | ICD-10-CM | POA: Diagnosis not present

## 2024-01-27 DIAGNOSIS — Z79899 Other long term (current) drug therapy: Secondary | ICD-10-CM | POA: Diagnosis not present

## 2024-01-27 DIAGNOSIS — F17210 Nicotine dependence, cigarettes, uncomplicated: Secondary | ICD-10-CM | POA: Diagnosis present

## 2024-01-27 DIAGNOSIS — J441 Chronic obstructive pulmonary disease with (acute) exacerbation: Secondary | ICD-10-CM | POA: Diagnosis present

## 2024-01-27 DIAGNOSIS — R0603 Acute respiratory distress: Principal | ICD-10-CM

## 2024-01-27 DIAGNOSIS — J439 Emphysema, unspecified: Secondary | ICD-10-CM | POA: Diagnosis present

## 2024-01-27 LAB — CBC WITH DIFFERENTIAL/PLATELET
Abs Immature Granulocytes: 0.47 10*3/uL — ABNORMAL HIGH (ref 0.00–0.07)
Basophils Absolute: 0.1 10*3/uL (ref 0.0–0.1)
Basophils Relative: 0 %
Eosinophils Absolute: 0.2 10*3/uL (ref 0.0–0.5)
Eosinophils Relative: 1 %
HCT: 39.9 % (ref 36.0–46.0)
Hemoglobin: 11.3 g/dL — ABNORMAL LOW (ref 12.0–15.0)
Immature Granulocytes: 2 %
Lymphocytes Relative: 26 %
Lymphs Abs: 6.7 10*3/uL — ABNORMAL HIGH (ref 0.7–4.0)
MCH: 20.1 pg — ABNORMAL LOW (ref 26.0–34.0)
MCHC: 28.3 g/dL — ABNORMAL LOW (ref 30.0–36.0)
MCV: 71.1 fL — ABNORMAL LOW (ref 80.0–100.0)
Monocytes Absolute: 1.7 10*3/uL — ABNORMAL HIGH (ref 0.1–1.0)
Monocytes Relative: 7 %
Neutro Abs: 16.7 10*3/uL — ABNORMAL HIGH (ref 1.7–7.7)
Neutrophils Relative %: 64 %
Platelets: 412 10*3/uL — ABNORMAL HIGH (ref 150–400)
RBC: 5.61 MIL/uL — ABNORMAL HIGH (ref 3.87–5.11)
RDW: 17.6 % — ABNORMAL HIGH (ref 11.5–15.5)
Smear Review: NORMAL
WBC: 25.8 10*3/uL — ABNORMAL HIGH (ref 4.0–10.5)
nRBC: 0 % (ref 0.0–0.2)

## 2024-01-27 LAB — CBC
HCT: 42.3 % (ref 36.0–46.0)
Hemoglobin: 11.7 g/dL — ABNORMAL LOW (ref 12.0–15.0)
MCH: 19.9 pg — ABNORMAL LOW (ref 26.0–34.0)
MCHC: 27.7 g/dL — ABNORMAL LOW (ref 30.0–36.0)
MCV: 72.1 fL — ABNORMAL LOW (ref 80.0–100.0)
Platelets: 434 10*3/uL — ABNORMAL HIGH (ref 150–400)
RBC: 5.87 MIL/uL — ABNORMAL HIGH (ref 3.87–5.11)
RDW: 17.7 % — ABNORMAL HIGH (ref 11.5–15.5)
WBC: 24.5 10*3/uL — ABNORMAL HIGH (ref 4.0–10.5)
nRBC: 0.1 % (ref 0.0–0.2)

## 2024-01-27 LAB — BLOOD GAS, VENOUS
Acid-Base Excess: 14.5 mmol/L — ABNORMAL HIGH (ref 0.0–2.0)
Bicarbonate: 43 mmol/L — ABNORMAL HIGH (ref 20.0–28.0)
O2 Saturation: 94.2 %
Patient temperature: 37
pCO2, Ven: 71 mmHg (ref 44–60)
pH, Ven: 7.39 (ref 7.25–7.43)
pO2, Ven: 66 mmHg — ABNORMAL HIGH (ref 32–45)

## 2024-01-27 LAB — COMPREHENSIVE METABOLIC PANEL WITH GFR
ALT: 23 U/L (ref 0–44)
AST: 17 U/L (ref 15–41)
Albumin: 3.3 g/dL — ABNORMAL LOW (ref 3.5–5.0)
Alkaline Phosphatase: 80 U/L (ref 38–126)
Anion gap: 11 (ref 5–15)
BUN: 15 mg/dL (ref 8–23)
CO2: 32 mmol/L (ref 22–32)
Calcium: 9.1 mg/dL (ref 8.9–10.3)
Chloride: 97 mmol/L — ABNORMAL LOW (ref 98–111)
Creatinine, Ser: 0.51 mg/dL (ref 0.44–1.00)
GFR, Estimated: >60 mL/min (ref >60)
Glucose, Bld: 111 mg/dL — ABNORMAL HIGH (ref 70–99)
Potassium: 3.7 mmol/L (ref 3.5–5.1)
Sodium: 140 mmol/L (ref 135–145)
Total Bilirubin: 0.4 mg/dL (ref 0.0–1.2)
Total Protein: 6.7 g/dL (ref 6.5–8.1)

## 2024-01-27 LAB — BASIC METABOLIC PANEL WITH GFR
Anion gap: 13 (ref 5–15)
BUN: 13 mg/dL (ref 8–23)
CO2: 25 mmol/L (ref 22–32)
Calcium: 8.3 mg/dL — ABNORMAL LOW (ref 8.9–10.3)
Chloride: 102 mmol/L (ref 98–111)
Creatinine, Ser: 0.66 mg/dL (ref 0.44–1.00)
GFR, Estimated: >60 mL/min (ref >60)
Glucose, Bld: 162 mg/dL — ABNORMAL HIGH (ref 70–99)
Potassium: 4.2 mmol/L (ref 3.5–5.1)
Sodium: 140 mmol/L (ref 135–145)

## 2024-01-27 LAB — TROPONIN I (HIGH SENSITIVITY)
Troponin I (High Sensitivity): 4 ng/L (ref <18)
Troponin I (High Sensitivity): 3 ng/L (ref <18)

## 2024-01-27 LAB — LACTIC ACID, PLASMA: Lactic Acid, Venous: 1.2 mmol/L (ref 0.5–1.9)

## 2024-01-27 LAB — RESP PANEL BY RT-PCR (RSV, FLU A&B, COVID)  RVPGX2
Influenza A by PCR: NEGATIVE
Influenza B by PCR: NEGATIVE
Resp Syncytial Virus by PCR: NEGATIVE
SARS Coronavirus 2 by RT PCR: NEGATIVE

## 2024-01-27 LAB — MAGNESIUM: Magnesium: 2.3 mg/dL (ref 1.7–2.4)

## 2024-01-27 LAB — HIV ANTIBODY (ROUTINE TESTING W REFLEX): HIV Screen 4th Generation wRfx: NONREACTIVE

## 2024-01-27 LAB — BRAIN NATRIURETIC PEPTIDE: B Natriuretic Peptide: 22.9 pg/mL (ref 0.0–100.0)

## 2024-01-27 MED ORDER — ONDANSETRON HCL 4 MG PO TABS
4.0000 mg | ORAL_TABLET | Freq: Four times a day (QID) | ORAL | Status: DC | PRN
Start: 2024-01-27 — End: 2024-01-28

## 2024-01-27 MED ORDER — MONTELUKAST SODIUM 10 MG PO TABS
10.0000 mg | ORAL_TABLET | Freq: Every day | ORAL | Status: DC
  Administered 2024-01-27 – 2024-01-28 (×2): 10 mg via ORAL
  Filled 2024-01-27 (×2): qty 1

## 2024-01-27 MED ORDER — MAGNESIUM SULFATE 2 GM/50ML IV SOLN
2.0000 g | Freq: Once | INTRAVENOUS | Status: AC
Start: 2024-01-27 — End: 2024-01-27
  Administered 2024-01-27: 2 g via INTRAVENOUS
  Filled 2024-01-27: qty 50

## 2024-01-27 MED ORDER — BUDESON-GLYCOPYRROL-FORMOTEROL 160-9-4.8 MCG/ACT IN AERO
2.0000 | INHALATION_SPRAY | Freq: Two times a day (BID) | RESPIRATORY_TRACT | Status: DC
  Administered 2024-01-28: 2 via RESPIRATORY_TRACT
  Filled 2024-01-27 (×2): qty 5.9

## 2024-01-27 MED ORDER — METHYLPREDNISOLONE SODIUM SUCC 40 MG IJ SOLR
40.0000 mg | Freq: Two times a day (BID) | INTRAMUSCULAR | Status: AC
  Administered 2024-01-27 (×2): 40 mg via INTRAVENOUS
  Filled 2024-01-27 (×2): qty 1

## 2024-01-27 MED ORDER — PREDNISONE 20 MG PO TABS
40.0000 mg | ORAL_TABLET | Freq: Every day | ORAL | Status: DC
  Administered 2024-01-28: 40 mg via ORAL
  Filled 2024-01-27: qty 2

## 2024-01-27 MED ORDER — BUDESONIDE 0.5 MG/2ML IN SUSP: 0.5000 mg | Freq: Three times a day (TID) | RESPIRATORY_TRACT | Status: DC

## 2024-01-27 MED ORDER — BUDESONIDE 0.5 MG/2ML IN SUSP
0.5000 mg | Freq: Every day | RESPIRATORY_TRACT | Status: DC
  Administered 2024-01-27: 0.5 mg via RESPIRATORY_TRACT
  Filled 2024-01-27: qty 2

## 2024-01-27 MED ORDER — SODIUM CHLORIDE 0.9 % IV SOLN
1.0000 g | INTRAVENOUS | Status: DC
  Administered 2024-01-27 – 2024-01-28 (×2): 1 g via INTRAVENOUS
  Filled 2024-01-27 (×2): qty 10

## 2024-01-27 MED ORDER — ACETAMINOPHEN 650 MG RE SUPP: 650.0000 mg | Freq: Four times a day (QID) | RECTAL | Status: DC | PRN

## 2024-01-27 MED ORDER — ONDANSETRON HCL 4 MG/2ML IJ SOLN: 4.0000 mg | Freq: Four times a day (QID) | INTRAMUSCULAR | Status: DC | PRN

## 2024-01-27 MED ORDER — IPRATROPIUM-ALBUTEROL 0.5-2.5 (3) MG/3ML IN SOLN
3.0000 mL | Freq: Four times a day (QID) | RESPIRATORY_TRACT | Status: DC
  Administered 2024-01-27 (×2): 3 mL via RESPIRATORY_TRACT
  Filled 2024-01-27 (×2): qty 3

## 2024-01-27 MED ORDER — AYR SALINE NASAL NA GEL
1.0000 | NASAL | Status: DC
  Administered 2024-01-27 (×2): 1 via NASAL
  Filled 2024-01-27: qty 1
  Filled 2024-01-27: qty 14.1

## 2024-01-27 MED ORDER — ALBUTEROL SULFATE (2.5 MG/3ML) 0.083% IN NEBU
2.5000 mg | INHALATION_SOLUTION | RESPIRATORY_TRACT | Status: DC | PRN
  Administered 2024-01-27: 2.5 mg via RESPIRATORY_TRACT
  Filled 2024-01-27: qty 3

## 2024-01-27 MED ORDER — ACETAMINOPHEN 325 MG PO TABS: 650.0000 mg | ORAL_TABLET | Freq: Four times a day (QID) | ORAL | Status: DC | PRN

## 2024-01-27 MED ORDER — PREDNISOLONE ACETATE 1 % OP SUSP
1.0000 [drp] | Freq: Two times a day (BID) | OPHTHALMIC | Status: DC
  Administered 2024-01-27 (×2): 1 [drp] via OPHTHALMIC
  Filled 2024-01-27: qty 5

## 2024-01-27 MED ORDER — GUAIFENESIN ER 600 MG PO TB12
600.0000 mg | ORAL_TABLET | Freq: Two times a day (BID) | ORAL | Status: DC
  Administered 2024-01-27 – 2024-01-28 (×4): 600 mg via ORAL
  Filled 2024-01-27 (×4): qty 1

## 2024-01-27 MED ORDER — BUDESONIDE 0.5 MG/2ML IN SUSP
0.5000 mg | Freq: Two times a day (BID) | RESPIRATORY_TRACT | Status: DC
  Administered 2024-01-27 – 2024-01-28 (×2): 0.5 mg via RESPIRATORY_TRACT
  Filled 2024-01-27 (×2): qty 2

## 2024-01-27 MED ORDER — IPRATROPIUM-ALBUTEROL 0.5-2.5 (3) MG/3ML IN SOLN
3.0000 mL | RESPIRATORY_TRACT | Status: DC
  Administered 2024-01-27 – 2024-01-28 (×4): 3 mL via RESPIRATORY_TRACT
  Filled 2024-01-27 (×4): qty 3

## 2024-01-27 MED ORDER — IPRATROPIUM-ALBUTEROL 0.5-2.5 (3) MG/3ML IN SOLN
3.0000 mL | Freq: Once | RESPIRATORY_TRACT | Status: AC
  Administered 2024-01-27: 3 mL via RESPIRATORY_TRACT
  Filled 2024-01-27: qty 3

## 2024-01-27 MED ORDER — MUPIROCIN 2 % EX OINT
TOPICAL_OINTMENT | Freq: Two times a day (BID) | CUTANEOUS | Status: DC
  Administered 2024-01-28: 1 via NASAL
  Filled 2024-01-27: qty 22

## 2024-01-27 MED ORDER — ENOXAPARIN SODIUM 40 MG/0.4ML IJ SOSY: 0.5000 mg/kg | PREFILLED_SYRINGE | INTRAMUSCULAR | Status: DC

## 2024-01-27 NOTE — ED Notes (Signed)
 Pt had short episode of epistaxis. Dr. Vallarie Gauze aware and at bedside.

## 2024-01-27 NOTE — ED Notes (Signed)
 Pt ate, used toilet, back in bed and on bipap.

## 2024-01-27 NOTE — ED Notes (Signed)
Transport arrival.

## 2024-01-27 NOTE — Progress Notes (Signed)
 Progress Note   Patient: Aimee Hooper WNU:[id number] DOB: [birth year] DOA: 01/27/2024     0 DOS: the patient was seen and examined on 01/27/2024   Brief hospital course: 62yo with h/o COPD on 4L home O2 with h/o intubation in 07/2022 who presented on 5/22 with SOB despite 2 recent courses of antibiotics and steroids.  She was diagnosed with COPD exacerbation and started on BIPAP on arrival due to increased WOB.  She took herself off BIPAP in order to eat food that was brought to her.    Assessment and Plan:  Acute on chronic respiratory failure with hypoxia due to COPD exacerbation  History of need for mechanical ventilation 07/2022 Presented with SOB despite 2 recent courses of steroids and antibiotics (azithromycin , Augmentin ) Started on BIPAP on arrival and again this AM; currently on home (4L) [place] O2 with improvement in symptoms Scheduled and as needed nebulized bronchodilators IV steroids Antitussives Rocephin , due to severity of exacerbation Continue BiPAP and wean as tolerated to baseline O2 requirement Follow blood cultures Close monitoring for worsening in view of history of requiring intubation Reports she stopped smoking 3 months ago   Epistaxis 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 Donnie Galea is reviewing the chart For now, will add humidifier, mupirocin, and nasal saline   Leukocytosis WBC 25,000 but chest x-ray clear Likely related to outpatient steroids Slightly improved today   Obesity, Class II Body mass index is 35.15 kg/m.Aaron Aas  Weight loss should be encouraged Outpatient PCP/bariatric medicine f/u encouraged Significantly low or high BMI is associated with higher medical risk including morbidity and mortality       Consultants: ENT - telephone only?  Procedures: None  Antibiotics: Ceftriaxone   5/22-27     Subjective: Anxious this AM, back on BIPAP.  Improved on recheck this afternoon and on [place] O2.  Physical Exam: Vitals:   01/27/24 0700 01/27/24 0718 01/27/24 0730 01/27/24 0800  BP: (!) 115/55  121/60 135/75  Pulse: 80  80 99  Resp: 18  (!) 23 (!) 33  Temp:  (!) 97.2 F (36.2 C)    TempSrc:  Oral    SpO2: 98%  98% 93%  Weight:      Height:        No intake or output data in the 24 hours ending 01/27/24 0821 Filed Weights   01/27/24 0043  Weight: 81.6 kg    Exam:  General:  Appears calm and comfortable and is in NAD, on BIPAP -> 4L [place] O2 Eyes:  PERRL, EOMI, normal lids, iris ENT:  grossly normal hearing, lips & tongue, mmm Cardiovascular:  RRR, no m/r/g. No LE edema.  Respiratory:   Expiratory wheezing with moderately poor air movement.  Mildly increased respiratory effort. Abdomen:  soft, NT, ND Skin:  no rash or induration seen on limited exam Musculoskeletal:  grossly normal tone BUE/BLE, good ROM, no bony abnormality Psychiatric:  blunted mood and affect, speech fluent and appropriate, AOx3 Neurologic:  CN 2-12 grossly intact, moves all extremities in coordinated fashion  Data Reviewed: I have reviewed the patient's lab results since admission.  Pertinent labs for today include:   Glucose 162 WBC 24.5 Hgb 11.7 Platelets 434    Family Communication: None present  Disposition: Status is: Inpatient Admit - It is my clinical opinion that admission to INPATIENT is reasonable and necessary because  of the expectation that this patient will require hospital care that crosses at least 2 midnights to treat this condition based on the medical complexity of the problems presented.  Given the aforementioned information, the predictability of an adverse outcome is felt to be significant.     Planned Discharge Destination: Home    Time spent: 50 minutes  Author: Lorita Rosa, MD 01/27/2024 8:19 AM  For on call review [email or web address].

## 2024-01-27 NOTE — Assessment & Plan Note (Signed)
 WBC 25,000 but chest x-ray clear Likely related to outpatient steroids Continue to monitor

## 2024-01-27 NOTE — ED Provider Notes (Signed)
 [facility] Provider Note    Event Date/Time   First MD Initiated Contact with Patient 01/27/24 [phone]     (approximate)   History   Respiratory distress   HPI  Level V caveat: Limited by distress  Aimee Hooper is a 63 y.o. female  brought to the ED via EMS from home with a chief complaint of respiratory distress. Patient with a history of COPD s/p intubation, on baseline 4L oxygen who has had persistent bronchitis status post 2 rounds of antibiotics and prednisone .  No improvement after doing 6 DuoNebs at home.  Endorses chest tightness.  EMS gave 3 DuoNebs, 125 mg IV Solu-Medrol .  Rest of history is limited secondary to patient's distress.      Past Medical History   Past Medical History:  Diagnosis Date   Asthma    Emphysema lung (HCC)    Obesity    Stage 3 severe COPD by GOLD classification [facility])      Active Problem List   Patient Active Problem List   Diagnosis Date Noted   COPD exacerbation (HCC) 01/27/2024   Acute on chronic respiratory failure with hypoxia (HCC) 01/27/2024   Leukocytosis 01/27/2024   Obesity, Class III, BMI 40-49.9 (morbid obesity) 01/27/2024   SIRS (systemic inflammatory response syndrome) (HCC) 01/27/2024   Epistaxis 01/27/2024   Chloride-responsive metabolic alkalosis 07/29/2022   COPD with acute exacerbation (HCC) 07/28/2022   Pneumonia due to infectious organism 07/23/2022   Respiratory failure (HCC) 07/22/2022   Pneumonia due to COVID-19 virus 09/01/2020   Acute respiratory failure due to COVID-19 (HCC) 09/01/2020   Stage 3 severe COPD by GOLD classification (HCC) 11/25/2016     Past Surgical History  History reviewed. No pertinent surgical history.   Home Medications   Prior to Admission medications   Medication Sig Start Date End Date Taking? Authorizing Provider  albuterol  (VENTOLIN  HFA) 108 (90 Base) MCG/ACT inhaler Inhale 2 puffs into the lungs every 4 (four) hours as needed. 07/27/23  07/26/24 Yes [provider]  amoxicillin -clavulanate (AUGMENTIN ) 875-125 MG tablet Take 1 tablet by mouth 2 (two) times daily. 08/04/22  Yes Krishnan, Sendil K, MD  BREZTRI AEROSPHERE 160-9-4.8 MCG/ACT AERO Inhale 2 puffs into the lungs 2 (two) times daily. 09/25/21  Yes [provider]  budesonide  (PULMICORT ) 0.5 MG/2ML nebulizer solution Take 0.5 mg by nebulization daily. 12/17/23  Yes [provider]  DUPIXENT 300 MG/2ML SOAJ Inject 300 mg into the skin every 14 (fourteen) days. 01/10/24  Yes [provider]  ipratropium-albuterol  (DUONEB) 0.5-2.5 (3) MG/3ML SOLN Take 3 mLs by nebulization every 6 (six) hours as needed. 08/04/22  Yes Krishnan, Sendil K, MD  montelukast (SINGULAIR) 10 MG tablet Take 10 mg by mouth daily. 08/09/23  Yes [provider]  prednisoLONE acetate (PRED FORTE) 1 % ophthalmic suspension Place 1 drop into the left eye 2 (two) times daily. 01/15/24  Yes [provider]  predniSONE  (DELTASONE ) 20 MG tablet Take 20 mg by mouth as directed. 3 tabs po QD x 3 days, then 2 tabs po QD x 3 days, then 1 tab po QD x 3 days, then 1/2 tab po QD x 4 days, then D/C 01/20/24  Yes [provider]  azithromycin  (ZITHROMAX ) 250 MG tablet Take 250 mg by mouth daily.    [provider]  budesonide -formoterol (SYMBICORT) 80-4.5 MCG/ACT inhaler Inhale 2 puffs into the lungs 2 (two) times daily. Patient not taking: Reported on 01/27/2024 12/17/21 12/17/22  [provider]  feeding supplement (ENSURE ENLIVE / ENSURE PLUS) LIQD Take 237 mLs by mouth 3 (three) times daily between meals. 08/04/22   Krishnan, Sendil K, MD  ipratropium (ATROVENT) 0.02 % nebulizer solution Take 0.5 mg by nebulization every 6 (six) hours as needed for wheezing. Patient not taking: Reported on 01/27/2024    [provider]  oxyCODONE -acetaminophen  (PERCOCET) 5-325 MG tablet Take 1 tablet by mouth every 6 (six) hours as needed for severe pain (pain  score 7-10). Patient not taking: Reported on 01/27/2024 10/01/23   Lynnda Sas, MD     Allergies  Morphine    Family History  History reviewed. No pertinent family history.   Physical Exam  Triage Vital Signs: ED Triage Vitals [01/27/24 0040]  Encounter Vitals Group     BP (!) 142/68     Systolic BP Percentile      Diastolic BP Percentile      Pulse Rate 96     Resp (!) 25     Temp 97.6 F (36.4 C)     Temp Source Axillary     SpO2 96 %     Weight      Height      Head Circumference      Peak Flow      Pain Score      Pain Loc      Pain Education      Exclude from Growth Chart     Updated Vital Signs: BP 118/67   Pulse 80   Temp 97.6 F (36.4 C) (Axillary)   Resp (!) 24   Ht 5' (1.524 m)   Wt 81.6 kg   SpO2 99%   BMI 35.15 kg/m    General: Awake, moderate distress.  CV:  Tachycardic.  Good peripheral perfusion.  Resp:  Increased effort.  Tripoding.  Diminished aeration, scattered wheezing. Abd:  Nontender.  No distention.  Other:  1+ BLE nonpitting edema.   ED Results / Procedures / Treatments  Labs (all labs ordered are listed, but only abnormal results are displayed) Labs Reviewed  CBC WITH DIFFERENTIAL/PLATELET - Abnormal; Notable for the following components:      Result Value   WBC 25.8 (*)    RBC 5.61 (*)    Hemoglobin 11.3 (*)    MCV 71.1 (*)    MCH 20.1 (*)    MCHC 28.3 (*)    RDW 17.6 (*)    Platelets 412 (*)    Neutro Abs 16.7 (*)    Lymphs Abs 6.7 (*)    Monocytes Absolute 1.7 (*)    Abs Immature Granulocytes 0.47 (*)    All other components within normal limits  COMPREHENSIVE METABOLIC PANEL WITH GFR - Abnormal; Notable for the following components:   Chloride 97 (*)    Glucose, Bld 111 (*)    Albumin 3.3 (*)    All other components within normal limits  CBC - Abnormal; Notable for the following components:   WBC 24.5 (*)    RBC 5.87 (*)    Hemoglobin 11.7 (*)    MCV 72.1 (*)    MCH 19.9 (*)    MCHC 27.7 (*)    RDW 17.7  (*)    Platelets 434 (*)    All other components within normal limits  RESP PANEL BY RT-PCR (RSV, FLU A&B, COVID)  RVPGX2  CULTURE, BLOOD (ROUTINE X 2)  CULTURE, BLOOD (ROUTINE X 2)  LACTIC ACID, PLASMA  BRAIN NATRIURETIC PEPTIDE  MAGNESIUM   BASIC METABOLIC PANEL WITH  GFR  HIV ANTIBODY (ROUTINE TESTING W REFLEX)  TROPONIN I (HIGH SENSITIVITY)  TROPONIN I (HIGH SENSITIVITY)     EKG  ED ECG REPORT I, Alexsus Papadopoulos J, the attending physician, personally viewed and interpreted this ECG.   Date: 01/27/2024  EKG Time: 0040  Rate: 105  Rhythm: sinus tachycardia  Axis: Normal  Intervals:none  ST&T Change: Nonspecific    RADIOLOGY I have independently visualized and interpreted patient's imaging study as well as noted the radiology interpretation:  Chest x-ray: Clear  Official radiology report(s): DG Chest Port 1 View Result Date: 01/27/2024 CLINICAL DATA:  SOB. Bronchitis x1week. 6 nebulizer at home with no relief. EXAM: PORTABLE CHEST 1 VIEW COMPARISON:  Chest x-ray 09/30/2023, CT chest 10/01/2023 FINDINGS: The heart and mediastinal contours are within normal limits. No focal consolidation. Chronic coarsened interstitial markings with no overt pulmonary edema. No pleural effusion. No pneumothorax. No acute osseous abnormality. IMPRESSION: No active disease. Electronically Signed   By: Morgane  Naveau M.D.   On: 01/27/2024 00:58     PROCEDURES:  Critical Care performed: Yes, see critical care procedure note(s)  CRITICAL CARE Performed by: Norlene Beavers   Total critical care time: 45 minutes  Critical care time was exclusive of separately billable procedures and treating other patients.  Critical care was necessary to treat or prevent imminent or life-threatening deterioration.  Critical care was time spent personally by me on the following activities: development of treatment plan with patient and/or surrogate as well as nursing, discussions with consultants, evaluation of  patient's response to treatment, examination of patient, obtaining history from patient or surrogate, ordering and performing treatments and interventions, ordering and review of laboratory studies, ordering and review of radiographic studies, pulse oximetry and re-evaluation of patient's condition.   Aaron Aas1-3 Lead EKG Interpretation  Performed by: Norlene Beavers, MD Authorized by: Norlene Beavers, MD     Interpretation: abnormal     ECG rate:  110   ECG rate assessment: tachycardic     Rhythm: sinus tachycardia     Ectopy: none     Conduction: normal   Comments:     Patient placed on cardiac monitor to evaluate for arrhythmias    MEDICATIONS ORDERED IN ED: Medications  acetaminophen  (TYLENOL ) tablet 650 mg (has no administration in time range)    Or  acetaminophen  (TYLENOL ) suppository 650 mg (has no administration in time range)  ondansetron  (ZOFRAN ) tablet 4 mg (has no administration in time range)    Or  ondansetron  (ZOFRAN ) injection 4 mg (has no administration in time range)  cefTRIAXone  (ROCEPHIN ) 1 g in sodium chloride  0.9 % 100 mL IVPB (0 g Intravenous Stopped 01/27/24 0321)  methylPREDNISolone  sodium succinate  (SOLU-MEDROL ) 40 mg/mL injection 40 mg (40 mg Intravenous Given 01/27/24 0248)    Followed by  predniSONE  (DELTASONE ) tablet 40 mg (has no administration in time range)  ipratropium-albuterol  (DUONEB) 0.5-2.5 (3) MG/3ML nebulizer solution 3 mL (has no administration in time range)  albuterol  (PROVENTIL ) (2.5 MG/3ML) 0.083% nebulizer solution 2.5 mg (has no administration in time range)  guaiFENesin  (MUCINEX ) 12 hr tablet 600 mg (600 mg Oral Given 01/27/24 0248)  ipratropium-albuterol  (DUONEB) 0.5-2.5 (3) MG/3ML nebulizer solution 3 mL (3 mLs Nebulization Given 01/27/24 0101)  magnesium  sulfate IVPB 2 g 50 mL (0 g Intravenous Stopped 01/27/24 0228)     IMPRESSION / MDM / ASSESSMENT AND PLAN / ED COURSE  I reviewed the triage vital signs and the nursing notes.  63 year old female presenting with respiratory distress and hypoxia. Differential includes, but is not limited to, viral syndrome, bronchitis including COPD exacerbation, pneumonia, reactive airway disease including asthma, CHF including exacerbation with or without pulmonary/interstitial edema, pneumothorax, ACS, thoracic trauma, and pulmonary embolism.  I personally reviewed patient's records and note a PCP office visit from 01/20/2024.  Patient's presentation is most consistent with acute presentation with potential threat to life or bodily function.  The patient is on the cardiac monitor to evaluate for evidence of arrhythmia and/or significant heart rate changes.  Will place patient urgently on BiPAP, administer 2 g IV magnesium , additional DuoNeb.  Obtain sepsis panel, chest x-ray.  Anticipate hospitalization.  Clinical Course as of 01/27/24 0610  Thu Jan 27, 2024  0147 Laboratory results demonstrate leukocytosis with negative lactic acid and negative chest x-ray for pneumonia.  Leukocytosis may be secondary to recent prednisone  use.  Patient improved on BiPAP.  Will consult hospitalist services for evaluation and admission. [JS]    Clinical Course User Index [JS] Norlene Beavers, MD     FINAL CLINICAL IMPRESSION(S) / ED DIAGNOSES   Final diagnoses:  Respiratory distress  Hypoxia  COPD with acute exacerbation (HCC)  Leukocytosis, unspecified type     Rx / DC Orders   ED Discharge Orders     None        Note:  This document was prepared using Dragon voice recognition software and may include unintentional dictation errors.   Debar Plate J, MD 01/27/24 [phone]

## 2024-01-27 NOTE — ED Notes (Signed)
 Pt assisted to bed side commode and provided a fresh gown and brief.

## 2024-01-27 NOTE — Assessment & Plan Note (Signed)
 SIRS criteria: Tachycardic, tachypneic with leukocytosis Suspect secondary to COPD exacerbation Follow cultures

## 2024-01-27 NOTE — Assessment & Plan Note (Addendum)
 Acute on chronic respiratory failure with hypoxia History of need for mechanical ventilation 07/2022 Scheduled and as needed nebulized bronchodilators IV steroids Antitussives Rocephin , due to severity of exacerbation Continue BiPAP and wean as tolerated to baseline O2 requirement Follow blood cultures Close monitoring for worsening in view of history of requiring intubation

## 2024-01-27 NOTE — H&P (Addendum)
 History and Physical    Patient: Aimee Hooper ZYS:[id number] DOB: July 27, 1961 DOA: 01/27/2024 DOS: the patient was seen and examined on 01/27/2024 PCP: Thais Fill, MD  Patient coming from: Home  Chief Complaint: Cough, shortness of breath and wheezing   HPI: Aimee Hooper is a 63 y.o. female with medical history significant for COPD and asthma on home O2 at 4 L , with history of requiring intubation and mechanical ventilation in November 2023 for severe exacerbation of COPD in the setting of RSV and Klebsiella pneumonia, who is being admitted with a COPD exacerbation requiring BiPAP,.  She presented with a several week history of cough and shortness of breath and wheezing that have persisted in spite of 2 courses of antibiotics and steroids and regular use of her home nebulizers..  She present did by EMS who found her with increased work of breathing and she was administered albuterol  x 2 as well as Solu-Medrol  en route.  She denied chest pain, fever or chills, lower extremity pain or swelling.  Following arrival to the hospital, she developed epistaxis which she said she has had before and is followed by ENT. ED course and data review: Tachypneic to mid 20s and tachycardic to the low 100s, requiring 4 L to maintain sats in the low 90s.  Due to increased work of breathing she was subsequently placed on BiPAP. Labs notable for WBC 25,000 with lactic acid 1.2 and negative respiratory viral panel Troponin 4 Hemoglobin 11.3 CMP mostly unremarkable  Chest x-ray with no active disease  Patient given magnesium  sulfate IVPB as well as additional DuoNebs  Hospitalist consulted for admission.     Review of Systems: As mentioned in the history of present illness. All other systems reviewed and are negative.  Past Medical History:  Diagnosis Date   Asthma    Emphysema lung (HCC)    Obesity    Stage 3 severe COPD by GOLD classification (HCC)    History reviewed. No pertinent  surgical history. Social History:  reports that she has been smoking cigarettes. She has never used smokeless tobacco. She reports that she does not drink alcohol  and does not use drugs.  Allergies  Allergen Reactions   Morphine  Swelling    History reviewed. No pertinent family history.  Prior to Admission medications   Medication Sig Start Date End Date Taking? Authorizing Provider  amoxicillin -clavulanate (AUGMENTIN ) 875-125 MG tablet Take 1 tablet by mouth 2 (two) times daily. 08/04/22   Krishnan, Sendil K, MD  BREZTRI AEROSPHERE 160-9-4.8 MCG/ACT AERO Inhale 2 puffs into the lungs 2 (two) times daily. 09/25/21   [provider]  budesonide -formoterol (SYMBICORT) 80-4.5 MCG/ACT inhaler Inhale 2 puffs into the lungs 2 (two) times daily. 12/17/21 12/17/22  [provider]  feeding supplement (ENSURE ENLIVE / ENSURE PLUS) LIQD Take 237 mLs by mouth 3 (three) times daily between meals. 08/04/22   Krishnan, Sendil K, MD  ipratropium (ATROVENT) 0.02 % nebulizer solution Take 0.5 mg by nebulization every 6 (six) hours as needed for wheezing.    [provider]  ipratropium-albuterol  (DUONEB) 0.5-2.5 (3) MG/3ML SOLN Take 3 mLs by nebulization every 6 (six) hours as needed. 08/04/22   Krishnan, Sendil K, MD  oxyCODONE -acetaminophen  (PERCOCET) 5-325 MG tablet Take 1 tablet by mouth every 6 (six) hours as needed for severe pain (pain score 7-10). 10/01/23   Lynnda Sas, MD    Physical Exam: Vitals:   01/27/24 0043 01/27/24 0045 01/27/24 0047 01/27/24 0100  BP:    Aaron Aas)  113/46  Pulse:  (!) 110 100 93  Resp:  16 (!) 21 (!) 21  Temp:      TempSrc:      SpO2:  93% 100% 98%  Weight: 81.6 kg     Height: 5' (1.524 m)      Physical Exam Vitals and nursing note reviewed.  Constitutional:      General: She is not in acute distress.    Comments: Sitting up in bed leaning forward, BiPAP mask on able to speak in 2 word sentences to mask  HENT:     Head: Normocephalic and  atraumatic.     Nose:     Comments: Epistaxis appears to have resolved at this time.  Drops of blood seen on her bed clothes Cardiovascular:     Rate and Rhythm: Regular rhythm. Tachycardia present.     Heart sounds: Normal heart sounds.  Pulmonary:     Effort: Tachypnea, accessory muscle usage and prolonged expiration present.     Breath sounds: Wheezing and rhonchi present.  Abdominal:     Palpations: Abdomen is soft.     Tenderness: There is no abdominal tenderness.  Musculoskeletal:     Right lower leg: No edema.     Left lower leg: No edema.  Neurological:     Mental Status: Mental status is at baseline.     Labs on Admission: I have personally reviewed following labs and imaging studies  CBC: Recent Labs  Lab 01/27/24 0047  WBC 25.8*  NEUTROABS 16.7*  HGB 11.3*  HCT 39.9  MCV 71.1*  PLT 412*   Basic Metabolic Panel: Recent Labs  Lab 01/27/24 0047  NA 140  K 3.7  CL 97*  CO2 32  GLUCOSE 111*  BUN 15  CREATININE 0.51  CALCIUM 9.1  MG 2.3   GFR: Estimated Creatinine Clearance: 68.1 mL/min (by C-G formula based on SCr of 0.51 mg/dL). Liver Function Tests: Recent Labs  Lab 01/27/24 0047  AST 17  ALT 23  ALKPHOS 80  BILITOT 0.4  PROT 6.7  ALBUMIN 3.3*   No results for input(s): "LIPASE", "AMYLASE" in the last 168 hours. No results for input(s): "AMMONIA" in the last 168 hours. Coagulation Profile: No results for input(s): "INR", "PROTIME" in the last 168 hours. Cardiac Enzymes: No results for input(s): "CKTOTAL", "CKMB", "CKMBINDEX", "TROPONINI" in the last 168 hours. BNP (last 3 results) No results for input(s): "PROBNP" in the last 8760 hours. HbA1C: No results for input(s): "HGBA1C" in the last 72 hours. CBG: No results for input(s): "GLUCAP" in the last 168 hours. Lipid Profile: No results for input(s): "CHOL", "HDL", "LDLCALC", "TRIG", "CHOLHDL", "LDLDIRECT" in the last 72 hours. Thyroid Function Tests: No results for input(s): "TSH",  "T4TOTAL", "FREET4", "T3FREE", "THYROIDAB" in the last 72 hours. Anemia Panel: No results for input(s): "VITAMINB12", "FOLATE", "FERRITIN", "TIBC", "IRON", "RETICCTPCT" in the last 72 hours. Urine analysis: No results found for: "COLORURINE", "APPEARANCEUR", "LABSPEC", "PHURINE", "GLUCOSEU", "HGBUR", "BILIRUBINUR", "KETONESUR", "PROTEINUR", "UROBILINOGEN", "NITRITE", "LEUKOCYTESUR"  Radiological Exams on Admission: DG Chest Port 1 View Result Date: 01/27/2024 CLINICAL DATA:  SOB. Bronchitis x1week. 6 nebulizer at home with no relief. EXAM: PORTABLE CHEST 1 VIEW COMPARISON:  Chest x-ray 09/30/2023, CT chest 10/01/2023 FINDINGS: The heart and mediastinal contours are within normal limits. No focal consolidation. Chronic coarsened interstitial markings with no overt pulmonary edema. No pleural effusion. No pneumothorax. No acute osseous abnormality. IMPRESSION: No active disease. Electronically Signed   By: Morgane  Naveau M.D.   On: 01/27/2024 00:58  Data Reviewed for HPI: Relevant notes from primary care and specialist visits, past discharge summaries as available in EHR, including Care Everywhere. Prior diagnostic testing as pertinent to current admission diagnoses Updated medications and problem lists for reconciliation ED course, including vitals, labs, imaging, treatment and response to treatment Triage notes, nursing and pharmacy notes and ED provider's notes Notable results as noted above in HPI      Assessment and Plan: * COPD exacerbation (HCC) Acute on chronic respiratory failure with hypoxia History of need for mechanical ventilation 07/2022 Scheduled and as needed nebulized bronchodilators IV steroids Antitussives Rocephin , due to severity of exacerbation Continue BiPAP and wean as tolerated to baseline O2 requirement Follow blood cultures Close monitoring for worsening in view of history of requiring intubation  SIRS (systemic inflammatory response syndrome) (HCC) SIRS  criteria: Tachycardic, tachypneic with leukocytosis Suspect secondary to COPD exacerbation Follow cultures  Leukocytosis WBC 25,000 but chest x-ray clear Likely related to outpatient steroids Continue to monitor  Obesity, Class III, BMI 40-49.9 (morbid obesity) Complicating factor to overall prognosis and care     DVT prophylaxis: SCD due to epistaxis  Consults: none  Advance Care Planning:   Code Status: Prior   Family Communication: none  Disposition Plan: Back to previous home environment  Severity of Illness: The appropriate patient status for this patient is OBSERVATION. Observation status is judged to be reasonable and necessary in order to provide the required intensity of service to ensure the patient's safety. The patient's presenting symptoms, physical exam findings, and initial radiographic and laboratory data in the context of their medical condition is felt to place them at decreased risk for further clinical deterioration. Furthermore, it is anticipated that the patient will be medically stable for discharge from the hospital within 2 midnights of admission.   Author: Lanetta Pion, MD 01/27/2024 2:11 AM  For on call review [email or web address].

## 2024-01-27 NOTE — Assessment & Plan Note (Signed)
 Complicating factor to overall prognosis and care

## 2024-01-27 NOTE — Assessment & Plan Note (Signed)
 Hold Lovenox  DVT prophylaxis SCDs

## 2024-01-27 NOTE — ED Notes (Signed)
 Sec to arrange transport for the patient to the floor.

## 2024-01-27 NOTE — ED Triage Notes (Addendum)
 SOB. Bronchitis x1week. 6 nebulizer at home with no relief. 2 albuterol  treatments and solumedrol 125 given by EMS. Chronic 4L. CBG 102 20G L

## 2024-01-27 NOTE — ED Notes (Signed)
 Pt took herself off bipap to eat food brought to her

## 2024-01-28 DIAGNOSIS — J441 Chronic obstructive pulmonary disease with (acute) exacerbation: Secondary | ICD-10-CM | POA: Diagnosis not present

## 2024-01-28 LAB — CBC WITH DIFFERENTIAL/PLATELET
Abs Immature Granulocytes: 0.62 10*3/uL — ABNORMAL HIGH (ref 0.00–0.07)
Basophils Absolute: 0.1 10*3/uL (ref 0.0–0.1)
Basophils Relative: 0 %
Eosinophils Absolute: 0 10*3/uL (ref 0.0–0.5)
Eosinophils Relative: 0 %
HCT: 39.7 % (ref 36.0–46.0)
Hemoglobin: 11 g/dL — ABNORMAL LOW (ref 12.0–15.0)
Immature Granulocytes: 2 %
Lymphocytes Relative: 16 %
Lymphs Abs: 4.1 10*3/uL — ABNORMAL HIGH (ref 0.7–4.0)
MCH: 19.9 pg — ABNORMAL LOW (ref 26.0–34.0)
MCHC: 27.7 g/dL — ABNORMAL LOW (ref 30.0–36.0)
MCV: 71.9 fL — ABNORMAL LOW (ref 80.0–100.0)
Monocytes Absolute: 1.8 10*3/uL — ABNORMAL HIGH (ref 0.1–1.0)
Monocytes Relative: 7 %
Neutro Abs: 18.9 10*3/uL — ABNORMAL HIGH (ref 1.7–7.7)
Neutrophils Relative %: 75 %
Platelets: 368 10*3/uL (ref 150–400)
RBC: 5.52 MIL/uL — ABNORMAL HIGH (ref 3.87–5.11)
RDW: 17.8 % — ABNORMAL HIGH (ref 11.5–15.5)
WBC: 25.4 10*3/uL — ABNORMAL HIGH (ref 4.0–10.5)
nRBC: 0 % (ref 0.0–0.2)

## 2024-01-28 LAB — BASIC METABOLIC PANEL WITH GFR
Anion gap: 8 (ref 5–15)
BUN: 16 mg/dL (ref 8–23)
CO2: 32 mmol/L (ref 22–32)
Calcium: 8.5 mg/dL — ABNORMAL LOW (ref 8.9–10.3)
Chloride: 99 mmol/L (ref 98–111)
Creatinine, Ser: 0.51 mg/dL (ref 0.44–1.00)
GFR, Estimated: >60 mL/min (ref >60)
Glucose, Bld: 89 mg/dL (ref 70–99)
Potassium: 4.1 mmol/L (ref 3.5–5.1)
Sodium: 139 mmol/L (ref 135–145)

## 2024-01-28 MED ORDER — PREDNISONE 20 MG PO TABS: 20.0000 mg | ORAL_TABLET | ORAL | 0 refills | Status: AC

## 2024-01-28 MED ORDER — AYR SALINE NASAL NA GEL: 1.0000 | NASAL | 0 refills | Status: AC

## 2024-01-28 MED ORDER — MUPIROCIN 2 % EX OINT: TOPICAL_OINTMENT | Freq: Two times a day (BID) | CUTANEOUS | 0 refills | Status: AC

## 2024-01-28 MED ORDER — GUAIFENESIN ER 600 MG PO TB12: 600.0000 mg | ORAL_TABLET | Freq: Two times a day (BID) | ORAL | 0 refills | Status: AC

## 2024-01-28 NOTE — Hospital Course (Signed)
 62yo with h/o COPD on 4L home O2 with h/o intubation in 07/2022 who presented on 5/22 with SOB despite 2 recent courses of antibiotics and steroids. She was diagnosed with COPD exacerbation and started on BIPAP on arrival due to increased WOB. She took herself off BIPAP in order to eat food that was brought to her and then went back on.  She was able to wean off BIPAP and is back on her home O2.

## 2024-01-28 NOTE — Discharge Summary (Signed)
 Physician Discharge Summary   Patient: Aimee Hooper MRN: [id number] DOB: 04-Jun-1961  Admit date:     01/27/2024  Discharge date: 01/28/24  Discharge Physician: Lorita Rosa   PCP: Thais Fill, MD   Recommendations at discharge:   Steroid taper provided Good job on stopping smoking!  Continue nicotine  patch as needed Follow up with Dr. Anastasia Balo at [facility]; call for an appointment Follow up with Dr. Steen Eden in 1-2 weeks; call for an appointment Follow up on 6/2 with Dr. Prescilla Brod to discuss possible embolization for nosebleeds  Discharge Diagnoses: Principal Problem:   COPD exacerbation (HCC) Active Problems:   Acute on chronic respiratory failure with hypoxia (HCC)   Leukocytosis   SIRS (systemic inflammatory response syndrome) (HCC)   COPD with acute exacerbation (HCC)   Obesity, Class III, BMI 40-49.9 (morbid obesity)   Epistaxis    Hospital Course: 62yo with h/o COPD on 4L home O2 with h/o intubation in 07/2022 who presented on 5/22 with SOB despite 2 recent courses of antibiotics and steroids. She was diagnosed with COPD exacerbation and started on BIPAP on arrival due to increased WOB. She took herself off BIPAP in order to eat food that was brought to her and then went back on.  She was able to wean off BIPAP and is back on her home O2.  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 Prescilla Brod on Monday, 6/2 re: possible embolization   Leukocytosis WBC 25,000 but chest x-ray clear Likely related to outpatient steroids Stable   Obesity, Class II Body mass index is 35.15 kg/m.Aaron Aas  Weight loss should be encouraged Outpatient PCP/bariatric medicine f/u encouraged Significantly low or high BMI is associated with higher medical risk including morbidity and mortality          Consultants: ENT - telephone only   Procedures: None   Antibiotics: Ceftriaxone  5/22-27   Pain control - [place]  Controlled Substance Reporting System database was reviewed. and patient was instructed, not to drive, operate heavy machinery, perform activities at heights, swimming or participation in water  activities or provide baby-sitting services while on Pain, Sleep and Anxiety Medications; until their outpatient Physician has advised to do so again. Also recommended to not to take more than prescribed Pain, Sleep and Anxiety Medications.   Disposition: Home Diet recommendation:  Regular diet DISCHARGE MEDICATION: Allergies as of 01/28/2024       Reactions   Morphine  Swelling        Medication List     STOP taking these medications    amoxicillin -clavulanate 875-125 MG tablet Commonly known as: AUGMENTIN    azithromycin  250 MG tablet Commonly known as: ZITHROMAX    budesonide -formoterol 80-4.5 MCG/ACT inhaler Commonly known as: SYMBICORT       TAKE these medications    albuterol  108 (90 Base) MCG/ACT inhaler Commonly known as: VENTOLIN  HFA Inhale 2 puffs into the lungs every 4 (four) hours as needed.   Breztri Aerosphere 160-9-4.8 MCG/ACT  Aero inhaler Generic drug: budesonide -glycopyrrolate-formoterol Inhale 2 puffs into the lungs 2 (two) times daily.   budesonide  0.5 MG/2ML nebulizer solution Commonly known as: PULMICORT  Take 0.5 mg by nebulization daily.   Dupixent 300 MG/2ML Soaj Generic drug:  Dupilumab Inject 300 mg into the skin every 14 (fourteen) days.   feeding supplement Liqd Take 237 mLs by mouth 3 (three) times daily between meals.   guaiFENesin  600 MG 12 hr tablet Commonly known as: MUCINEX  Take 1 tablet (600 mg total) by mouth 2 (two) times daily.   ipratropium-albuterol  0.5-2.5 (3) MG/3ML Soln Commonly known as: DUONEB Take 3 mLs by nebulization every 6 (six) hours as needed.   montelukast 10 MG tablet Commonly known as: SINGULAIR Take 10 mg by mouth daily.   mupirocin ointment 2 % Commonly known as: BACTROBAN Place into the nose 2 (two) times daily.   prednisoLONE acetate 1 % ophthalmic suspension Commonly known as: PRED FORTE Place 1 drop into the left eye 2 (two) times daily.   predniSONE  20 MG tablet Commonly known as: DELTASONE  Take 1 tablet (20 mg total) by mouth as directed. 3 tabs po QD x 3 days, then 2 tabs po QD x 3 days, then 1 tab po QD x 3 days, then 1/2 tab po QD x 4 days, then D/C   saline Gel Place 1 Application into both nostrils every 4 (four) hours.        Discharge Exam:   Subjective: Feeling better, would like to go home today.   Objective: Vitals:   01/28/24 0738 01/28/24 1104  BP: (!) 104/50 (!) 105/35  Pulse: 75 79  Resp:  18  Temp: 97.7 F (36.5 C) 97.9 F (36.6 C)  SpO2: 100% 100%    Intake/Output Summary (Last 24 hours) at 01/28/2024 1201 Last data filed at 01/28/2024 1025 Gross per 24 hour  Intake 400 ml  Output --  Net 400 ml   Filed Weights   01/27/24 0043  Weight: 81.6 kg    Exam:  General:  Appears calm and comfortable and is in NAD, on BIPAP -> 4L [place] O2 Eyes:  PERRL, EOMI, normal lids, iris ENT:  grossly normal hearing, lips & tongue, mmm Cardiovascular:  RRR, no m/r/g. No LE edema.  Respiratory:   Expiratory wheezing with moderately poor air movement.  Mildly increased respiratory effort. Abdomen:  soft, NT, ND Skin:  no rash or induration seen on limited exam Musculoskeletal:  grossly  normal tone BUE/BLE, good ROM, no bony abnormality Psychiatric:  blunted mood and affect, speech fluent and appropriate, AOx3 Neurologic:  CN 2-12 grossly intact, moves all extremities in coordinated fashion   Data Reviewed: I have reviewed the patient's lab results since admission.  Pertinent labs for today include:   Normal BMP WBC 25.4 Hgb 11    Condition at discharge: fair  The results of significant diagnostics from this hospitalization (including imaging, microbiology, ancillary and laboratory) are listed below for reference.   Imaging Studies: DG Chest Port 1 View Result Date: 01/27/2024 CLINICAL DATA:  SOB. Bronchitis x1week. 6 nebulizer at home with no relief. EXAM: PORTABLE CHEST 1 VIEW COMPARISON:  Chest x-ray 09/30/2023, CT chest 10/01/2023 FINDINGS: The heart and mediastinal contours are within normal limits. No focal consolidation. Chronic coarsened interstitial markings with no overt pulmonary edema. No pleural effusion. No pneumothorax. No acute osseous abnormality. IMPRESSION: No active disease. Electronically Signed   By: Morgane  Naveau M.D.   On: 01/27/2024 00:58    Microbiology: Results for orders  placed or performed during the hospital encounter of 01/27/24  Culture, blood (routine x 2)     Status: None (Preliminary result)   Collection Time: 01/27/24 12:47 AM   Specimen: BLOOD LEFT ARM  Result Value Ref Range Status   Specimen Description BLOOD LEFT ARM  Final   Special Requests   Final    BOTTLES DRAWN AEROBIC AND ANAEROBIC Blood Culture results may not be optimal due to an inadequate volume of blood received in culture bottles   Culture   Final    NO GROWTH 1 DAY Performed at [facility], [address], [place], [place] [address]    Report Status PENDING  Incomplete  Resp panel by RT-PCR (RSV, Flu A&B, Covid) Anterior Nasal Swab     Status: None   Collection Time: 01/27/24 12:47 AM   Specimen: Anterior Nasal Swab  Result Value Ref Range  Status   SARS Coronavirus 2 by RT PCR NEGATIVE NEGATIVE Final    Comment: (NOTE) SARS-CoV-2 target nucleic acids are NOT DETECTED.  The SARS-CoV-2 RNA is generally detectable in upper respiratory specimens during the acute phase of infection. The lowest concentration of SARS-CoV-2 viral copies this assay can detect is 138 copies/mL. A negative result does not preclude SARS-Cov-2 infection and should not be used as the sole basis for treatment or other patient management decisions. A negative result may occur with  improper specimen collection/handling, submission of specimen other than nasopharyngeal swab, presence of viral mutation(s) within the areas targeted by this assay, and inadequate number of viral copies(<138 copies/mL). A negative result must be combined with clinical observations, patient history, and epidemiological information. The expected result is Negative.  Fact Sheet for Patients:  BloggerCourse.com  Fact Sheet for Healthcare Providers:  SeriousBroker.it  This test is no t yet approved or cleared by the [place]  FDA and  has been authorized for detection and/or diagnosis of SARS-CoV-2 by FDA under an Emergency Use Authorization (EUA). This EUA will remain  in effect (meaning this test can be used) for the duration of the COVID-19 declaration under Section 564(b)(1) of the Act, 21 U.S.C.section 360bbb-3(b)(1), unless the authorization is terminated  or revoked sooner.       Influenza A by PCR NEGATIVE NEGATIVE Final   Influenza B by PCR NEGATIVE NEGATIVE Final    Comment: (NOTE) The Xpert Xpress SARS-CoV-2/FLU/RSV plus assay is intended as an aid in the diagnosis of influenza from Nasopharyngeal swab specimens and should not be used as a sole basis for treatment. Nasal washings and aspirates are unacceptable for Xpert Xpress SARS-CoV-2/FLU/RSV testing.  Fact Sheet for  Patients: BloggerCourse.com  Fact Sheet for Healthcare Providers: SeriousBroker.it  This test is not yet approved or cleared by the [place]  FDA and has been authorized for detection and/or diagnosis of SARS-CoV-2 by FDA under an Emergency Use Authorization (EUA). This EUA will remain in effect (meaning this test can be used) for the duration of the COVID-19 declaration under Section 564(b)(1) of the Act, 21 U.S.C. section 360bbb-3(b)(1), unless the authorization is terminated or revoked.     Resp Syncytial Virus by PCR NEGATIVE NEGATIVE Final    Comment: (NOTE) Fact Sheet for Patients: BloggerCourse.com  Fact Sheet for Healthcare Providers: SeriousBroker.it  This test is not yet approved or cleared by the [place]  FDA and has been authorized for detection and/or diagnosis of SARS-CoV-2 by FDA under an Emergency Use Authorization (EUA). This EUA will remain in effect (meaning this test can be used) for the  duration of the COVID-19 declaration under Section 564(b)(1) of the Act, 21 U.S.C. section 360bbb-3(b)(1), unless the authorization is terminated or revoked.  Performed at [facility], [address] [place], [place] [address]   Culture, blood (routine x 2)     Status: None (Preliminary result)   Collection Time: 01/27/24  1:30 AM   Specimen: BLOOD  Result Value Ref Range Status   Specimen Description BLOOD BLOOD LEFT ARM  Final   Special Requests   Final    BOTTLES DRAWN AEROBIC AND ANAEROBIC Blood Culture results may not be optimal due to an inadequate volume of blood received in culture bottles   Culture   Final    NO GROWTH 1 DAY Performed at [facility], [address] [place], [place] [address]    Report Status PENDING  Incomplete    Labs: CBC: Recent Labs  Lab 01/27/24 0047 01/27/24 0441 01/28/24 0848  WBC 25.8* 24.5* 25.4*   NEUTROABS 16.7*  --  18.9*  HGB 11.3* 11.7* 11.0*  HCT 39.9 42.3 39.7  MCV 71.1* 72.1* 71.9*  PLT 412* 434* 368   Basic Metabolic Panel: Recent Labs  Lab 01/27/24 0047 01/27/24 0441 01/28/24 0848  NA 140 140 139  K 3.7 4.2 4.1  CL 97* 102 99  CO2 32 25 32  GLUCOSE 111* 162* 89  BUN 15 13 16   CREATININE 0.51 0.66 0.51  CALCIUM 9.1 8.3* 8.5*  MG 2.3  --   --    Liver Function Tests: Recent Labs  Lab 01/27/24 0047  AST 17  ALT 23  ALKPHOS 80  BILITOT 0.4  PROT 6.7  ALBUMIN 3.3*   CBG: No results for input(s): "GLUCAP" in the last 168 hours.  Discharge time spent: greater than 30 minutes.  Signed: Lorita Rosa, MD [facility] 01/28/2024

## 2024-02-01 LAB — CULTURE, BLOOD (ROUTINE X 2)
Culture: NO GROWTH
Culture: NO GROWTH

## 2024-02-05 NOTE — Progress Notes (Signed)
 MRN : [id number]  Aimee Hooper is a 63 y.o. (1961-07-02) female who presents with chief complaint of check circulation.  History of Present Illness:   The patient presents to the office today  with medical history significant for COPD and asthma on home O2 at 4 L , with history of requiring intubation and mechanical ventilation in November 2023 for severe exacerbation of COPD.  Her care has been complicated with frequent epistaxis.  Location: Nose Character/quality of the symptom: Bleeding Severity: Severe Duration: Ongoing Timing/onset: Intermittent  Aggravating/context: Dry weather Relieving/modifying: none   No outpatient medications have been marked as taking for the 02/07/24 encounter (Appointment) with Jama Cordella MATSU, MD.    Past Medical History:  Diagnosis Date   Asthma    Emphysema lung (HCC)    Obesity    Stage 3 severe COPD by GOLD classification (HCC)     No past surgical history on file.  Social History Social History   Tobacco Use   Smoking status: Some Days    Types: Cigarettes   Smokeless tobacco: Never  Substance Use Topics   Alcohol  use: Never   Drug use: Never    Family History No family history on file.  Allergies  Allergen Reactions   Morphine  Swelling     REVIEW OF SYSTEMS (Negative unless checked)  Constitutional: [] Weight loss  [] Fever  [] Chills Cardiac: [] Chest pain   [] Chest pressure   [] Palpitations   [] Shortness of breath when laying flat   [] Shortness of breath with exertion. Vascular:  [x] Pain in legs with walking   [] Pain in legs at rest  [] History of DVT   [] Phlebitis   [] Swelling in legs   [] Varicose veins   [] Non-healing ulcers Pulmonary:   [] Uses home oxygen   [] Productive cough   [] Hemoptysis   [] Wheeze  [] COPD   [] Asthma Neurologic:  [] Dizziness   [] Seizures   [] History of stroke   [] History of TIA  [] Aphasia   [] Vissual changes   [] Weakness or  numbness in arm   [] Weakness or numbness in leg Musculoskeletal:   [] Joint swelling   [] Joint pain   [] Low back pain Hematologic:  [] Easy bruising  [] Easy bleeding   [] Hypercoagulable state   [] Anemic Gastrointestinal:  [] Diarrhea   [] Vomiting  [] Gastroesophageal reflux/heartburn   [] Difficulty swallowing. Genitourinary:  [] Chronic kidney disease   [] Difficult urination  [] Frequent urination   [] Blood in urine Skin:  [] Rashes   [] Ulcers  Psychological:  [] History of anxiety   []  History of major depression.  Physical Examination  There were no vitals filed for this visit. There is no height or weight on file to calculate BMI. Gen: WD/WN, NAD Head: [place]/AT, No temporalis wasting.  Ear/Nose/Throat: Hearing grossly intact, nares w/o erythema or drainage Eyes: PER, EOMI, sclera nonicteric.  Neck: Supple, no masses.  No bruit or JVD.  Pulmonary:  Good air movement, no audible wheezing, no use of accessory muscles.  Cardiac: RRR, normal S1, S2, no Murmurs. Vascular:  mild trophic changes, no open wounds Vessel Right Left  Radial Palpable Palpable  Gastrointestinal: soft, non-distended. No guarding/no peritoneal signs.  Musculoskeletal: M/S 5/5 throughout.  No visible deformity.  Neurologic: CN 2-12 intact. Pain and light touch intact in extremities.  Symmetrical.  Speech is fluent. Motor exam as listed above. Psychiatric: Judgment intact, Mood & affect appropriate for pt's clinical situation. Dermatologic: No rashes or ulcers noted.  No changes consistent with cellulitis.   CBC Lab Results  Component Value Date   WBC 25.4 (H) 01/28/2024   HGB 11.0 (L) 01/28/2024   HCT 39.7 01/28/2024   MCV 71.9 (L) 01/28/2024   PLT 368 01/28/2024    BMET    Component Value Date/Time   NA 139 01/28/2024 0848   K 4.1 01/28/2024 0848   CL 99 01/28/2024 0848   CO2 32 01/28/2024 0848   GLUCOSE 89 01/28/2024 0848   BUN 16 01/28/2024 0848   CREATININE 0.51 01/28/2024 0848   CALCIUM 8.5 (L)  01/28/2024 0848   GFRNONAA >60 01/28/2024 0848   Estimated Creatinine Clearance: 68.1 mL/min (by C-G formula based on SCr of 0.51 mg/dL).  COAG No results found for: INR, PROTIME  Radiology DG Chest Port 1 View Result Date: 01/27/2024 CLINICAL DATA:  SOB. Bronchitis x1week. 6 nebulizer at home with no relief. EXAM: PORTABLE CHEST 1 VIEW COMPARISON:  Chest x-ray 09/30/2023, CT chest 10/01/2023 FINDINGS: The heart and mediastinal contours are within normal limits. No focal consolidation. Chronic coarsened interstitial markings with no overt pulmonary edema. No pleural effusion. No pneumothorax. No acute osseous abnormality. IMPRESSION: No active disease. Electronically Signed   By: Morgane  Naveau M.D.   On: 01/27/2024 00:58     Assessment/Plan 1. Epistaxis (Primary) We discussed the possibility of embolization.  This would be seen as a last resort.  I certainly would not recommend embolization as a prophylactic measure.  I explained what embolization would entail and the process of an angiogram.  She voiced understanding.  She will follow-up with ENT for now.  We will not move forward with embolization at this time.  2. Stage 3 severe COPD by GOLD classification (HCC) Continue pulmonary medications and aerosols as already ordered, these medications have been reviewed and there are no changes at this time.     Cordella Shawl, MD  02/05/2024 3:46 PM

## 2024-02-07 ENCOUNTER — Ambulatory Visit (INDEPENDENT_AMBULATORY_CARE_PROVIDER_SITE_OTHER): Admitting: Vascular Surgery

## 2024-02-07 ENCOUNTER — Encounter (INDEPENDENT_AMBULATORY_CARE_PROVIDER_SITE_OTHER): Payer: Self-pay | Admitting: Vascular Surgery

## 2024-02-07 VITALS — BP 126/71 | HR 95 | Resp 18 | Ht 60.0 in | Wt 193.4 lb

## 2024-02-07 DIAGNOSIS — R040 Epistaxis: Secondary | ICD-10-CM | POA: Diagnosis not present

## 2024-02-07 DIAGNOSIS — J449 Chronic obstructive pulmonary disease, unspecified: Secondary | ICD-10-CM | POA: Diagnosis not present

## 2024-02-26 ENCOUNTER — Encounter (INDEPENDENT_AMBULATORY_CARE_PROVIDER_SITE_OTHER): Payer: Self-pay | Admitting: Vascular Surgery
